# Patient Record
Sex: Female | Born: 1950 | Race: White | Hispanic: No | Smoking: Never smoker
Health system: Southern US, Community
[De-identification: ages and names within clinical notes are randomized; demographics above are authoritative.]

## PROBLEM LIST (undated history)

## (undated) DIAGNOSIS — T7840XA Allergy, unspecified, initial encounter: Secondary | ICD-10-CM

## (undated) DIAGNOSIS — M1990 Unspecified osteoarthritis, unspecified site: Secondary | ICD-10-CM

## (undated) DIAGNOSIS — J302 Other seasonal allergic rhinitis: Secondary | ICD-10-CM

## (undated) HISTORY — PX: HIP ARTHROPLASTY: SHX981

## (undated) HISTORY — DX: Other seasonal allergic rhinitis: J30.2

## (undated) HISTORY — PX: TOTAL VAGINAL HYSTERECTOMY: SHX2548

## (undated) HISTORY — DX: Allergy, unspecified, initial encounter: T78.40XA

## (undated) HISTORY — DX: Unspecified osteoarthritis, unspecified site: M19.90

## (undated) HISTORY — PX: COLONOSCOPY: SHX174

## (undated) HISTORY — PX: WISDOM TOOTH EXTRACTION: SHX21

---

## 2002-10-15 ENCOUNTER — Ambulatory Visit (HOSPITAL_BASED_OUTPATIENT_CLINIC_OR_DEPARTMENT_OTHER): Admission: RE | Admit: 2002-10-15 | Discharge: 2002-10-15 | Payer: Self-pay | Admitting: Plastic Surgery

## 2007-02-21 ENCOUNTER — Ambulatory Visit (HOSPITAL_BASED_OUTPATIENT_CLINIC_OR_DEPARTMENT_OTHER): Admission: RE | Admit: 2007-02-21 | Discharge: 2007-02-21 | Payer: Self-pay | Admitting: Orthopedic Surgery

## 2008-05-16 ENCOUNTER — Encounter (INDEPENDENT_AMBULATORY_CARE_PROVIDER_SITE_OTHER): Payer: Self-pay | Admitting: *Deleted

## 2009-02-12 ENCOUNTER — Telehealth: Payer: Self-pay | Admitting: Gastroenterology

## 2010-03-02 NOTE — Progress Notes (Signed)
Summary: Schedule Colonoscopy  Phone Note Outgoing Call   Call placed by: Hortense Ramal CMA Duncan Dull),  February 12, 2009 10:07 AM Call placed to: Patient Summary of Call: Left message on patient's voicemail that it is time for her recall colonoscopy. I have asked the patient to call back. Initial call taken by: Hortense Ramal CMA Duncan Dull),  February 12, 2009 10:08 AM  Follow-up for Phone Call        Left message to call back. Hortense Ramal CMA Duncan Dull)  February 17, 2009 12:35 PM   Left message for patient to call back. Hortense Ramal CMA Duncan Dull)  February 20, 2009 10:09 AM   Additional Follow-up for Phone Call Additional follow up Details #1::        No call recieved from patient. We will send a letter. Additional Follow-up by: Hortense Ramal CMA Duncan Dull),  February 24, 2009 9:57 AM

## 2010-03-11 ENCOUNTER — Other Ambulatory Visit: Payer: Self-pay | Admitting: Dermatology

## 2010-06-15 NOTE — Op Note (Signed)
NAME:  Brittney Stark, Brittney Stark NO.:  [id number]   MEDICAL RECORD NO.:  [id number]          PATIENT TYPE:  AMB   LOCATION:  [facility]                          FACILITY:  [facility]   PHYSICIAN:  Mila Homer. Sherlean Foot, M.D. DATE OF BIRTH:  15-Sep-1950   DATE OF PROCEDURE:  02/21/2007  DATE OF DISCHARGE:                               OPERATIVE REPORT   SURGEON:  Mila Homer. Sherlean Foot, M.D.   ASSISTANT:  None.   ANESTHESIA:  MAC.   PREOPERATIVE DIAGNOSIS:  Right knee medial and lateral meniscus tears  and osteoarthritis.   POSTOPERATIVE DIAGNOSIS:  Right knee medial and lateral meniscus tears  and osteoarthritis.   PROCEDURE:  Right knee partial medial meniscectomy and partial lateral  meniscectomy and microfracture of the medial femoral condyle.   INDICATIONS FOR PROCEDURE:  The patient is a 60 year old white female  with pain, mechanical symptoms and MRI evidence of meniscus tearing,  radiographic evidence of osteoarthritis.  Informed consent obtained.   DESCRIPTION OF PROCEDURE:  The patient was laid supine under general  anesthesia, the knee was prepped and draped in the usual sterile  fashion.  Inferolateral and inferomedial portals were created with a #11  blade and blunt trocar and cannula.  Diagnostic arthroscopy revealed no  chondromalacia in the patellofemoral joint.  No chondromalacia in the  lateral compartment but radial tearing of the entirety of the lateral  meniscus and a large radial tear of the posterior horn of the lateral  meniscus.  This was debrided with a straight basket forceps, upbiting  basket forceps and a Biochemist, clinical.  Again, minimal chondromalacia.  ACL and PCL were intact.  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  This was debrided down to  bone and then  approximately 6 microfracture holes were placed with the  pick.  The medial meniscus had large radial tears posteriorly and these  were debrided with straight basket forceps and a Automatic [employer] shaver.  I  revisited all three compartments to make sure all the cartilage had been  debrided back to stable rims of tissue and make sure all loose bodies  were debrided.  I then evacuated the fluid and instruments, closed with  4-0 nylon sutures.  Infiltrated with 10 mL of Marcaine morphine mixture  in both portals and dressed with Xeroform dressing sponges, sterile  Webril and Ace wrap.   COMPLICATIONS:  None.   DRAINS:  None.           ______________________________  Mila Homer. Sherlean Foot, M.D.     SDL/MEDQ  D:  02/21/2007  T:  02/21/2007  Job:  [id number]

## 2011-08-16 ENCOUNTER — Encounter: Payer: Self-pay | Admitting: Internal Medicine

## 2012-03-13 DIAGNOSIS — M25569 Pain in unspecified knee: Secondary | ICD-10-CM | POA: Insufficient documentation

## 2014-05-12 ENCOUNTER — Other Ambulatory Visit: Payer: Self-pay | Admitting: Internal Medicine

## 2014-05-12 DIAGNOSIS — S060X1A Concussion with loss of consciousness of 30 minutes or less, initial encounter: Secondary | ICD-10-CM

## 2014-05-14 ENCOUNTER — Ambulatory Visit
Admission: RE | Admit: 2014-05-14 | Discharge: 2014-05-14 | Disposition: A | Discharge status: Home / Self Care | Payer: BLUE CROSS/BLUE SHIELD | Source: Ambulatory Visit | Attending: Internal Medicine | Admitting: Internal Medicine

## 2014-05-14 DIAGNOSIS — S060X1A Concussion with loss of consciousness of 30 minutes or less, initial encounter: Secondary | ICD-10-CM

## 2015-08-28 ENCOUNTER — Ambulatory Visit (INDEPENDENT_AMBULATORY_CARE_PROVIDER_SITE_OTHER): Payer: Self-pay | Admitting: Urgent Care

## 2015-08-28 VITALS — BP 122/72 | HR 71 | Temp 99.1°F | Resp 17 | Ht 68.0 in | Wt 220.0 lb

## 2015-08-28 DIAGNOSIS — J069 Acute upper respiratory infection, unspecified: Secondary | ICD-10-CM

## 2015-08-28 DIAGNOSIS — J029 Acute pharyngitis, unspecified: Secondary | ICD-10-CM

## 2015-08-28 DIAGNOSIS — R05 Cough: Secondary | ICD-10-CM

## 2015-08-28 DIAGNOSIS — R059 Cough, unspecified: Secondary | ICD-10-CM

## 2015-08-28 DIAGNOSIS — D179 Benign lipomatous neoplasm, unspecified: Secondary | ICD-10-CM

## 2015-08-28 MED ORDER — HYDROCODONE-HOMATROPINE 5-1.5 MG/5ML PO SYRP: 5.0000 mL | ORAL_SOLUTION | Freq: Every evening | ORAL | 0 refills | Status: DC | PRN

## 2015-08-28 MED ORDER — BENZONATATE 100 MG PO CAPS: 100.0000 mg | ORAL_CAPSULE | Freq: Three times a day (TID) | ORAL | 0 refills | Status: DC | PRN

## 2015-08-28 NOTE — Progress Notes (Signed)
    MRN: [id number] DOB: 1950-10-26  Subjective:   Brittney Stark is a 65 y.o. female presenting for chief complaint of Cough; Sore Throat; and Headache  Reports 1 week history of productive cough, sore throat, hoarseness, headache, subjective fever. Has been taking Zyrtec daily for her allergies. Denies sinus pain, ear pain, chest pain, shob, n/v, abdominal pain, rashes. Denies smoking cigarettes.   Lynnon has a current medication list which includes the following prescription(s): cetirizine. Also has no allergies on file.  Marissia  has no past medical history on file. Also  has no past surgical history on file.  Objective:   Vitals: BP 122/72 (BP Location: Right Arm, Patient Position: Sitting, Cuff Size: Normal)   Pulse 71   Temp 99.1 F (37.3 C) (Oral)   Resp 17   Ht 5\' 8"  (1.727 m)   Wt 220 lb (99.8 kg)   SpO2 95%   BMI 33.45 kg/m   Physical Exam  Constitutional: She is oriented to person, place, and time. She appears well-developed and well-nourished.  HENT:  TM's intact bilaterally, no effusions or erythema. Nasal turbinates pink and moist, nasal passages patent. No sinus tenderness. Oropharynx clear, mucous membranes moist, dentition in good repair.  Eyes: Right eye exhibits no discharge. Left eye exhibits no discharge. No scleral icterus.  Neck: Normal range of motion. Neck supple.  Cardiovascular: Normal rate, regular rhythm and intact distal pulses.  Exam reveals no gallop and no friction rub.   No murmur heard. Pulmonary/Chest: No respiratory distress. She has no wheezes. She has no rales.  Musculoskeletal:       Arms: Lymphadenopathy:    She has no cervical adenopathy.  Neurological: She is alert and oriented to person, place, and time.  Skin: Skin is warm and dry.   Assessment and Plan :   1. Acute upper respiratory infection 2. Cough 3. Sore throat - Likely undergoing viral syndrome, advised supportive care. Call clinic on Monday if no improvement, consider antibiotic  course at that point.  4. Lipoma - Will monitor, patient will let me know if she would like to have it removed.  Jaynee Eagles, PA-C [facility] [phone] 08/28/2015 8:27 AM

## 2015-08-28 NOTE — Patient Instructions (Addendum)
Cough, Adult Coughing is a reflex that clears your throat and your airways. Coughing helps to heal and protect your lungs. It is normal to cough occasionally, but a cough that happens with other symptoms or lasts a long time may be a sign of a condition that needs treatment. A cough may last only 2-3 weeks (acute), or it may last longer than 8 weeks (chronic). CAUSES Coughing is commonly caused by:  Breathing in substances that irritate your lungs.  A viral or bacterial respiratory infection.  Allergies.  Asthma.  Postnasal drip.  Smoking.  Acid backing up from the stomach into the esophagus (gastroesophageal reflux).  Certain medicines.  Chronic lung problems, including COPD (or rarely, lung cancer).  Other medical conditions such as heart failure. HOME CARE INSTRUCTIONS  Pay attention to any changes in your symptoms. Take these actions to help with your discomfort:  Take medicines only as told by your health care provider.  If you were prescribed an antibiotic medicine, take it as told by your health care provider. Do not stop taking the antibiotic even if you start to feel better.  Talk with your health care provider before you take a cough suppressant medicine.  Drink enough fluid to keep your urine clear or pale yellow.  If the air is dry, use a cold steam vaporizer or humidifier in your bedroom or your home to help loosen secretions.  Avoid anything that causes you to cough at work or at home.  If your cough is worse at night, try sleeping in a semi-upright position.  Avoid cigarette smoke. If you smoke, quit smoking. If you need help quitting, ask your health care provider.  Avoid caffeine.  Avoid alcohol.  Rest as needed. SEEK MEDICAL CARE IF:   You have new symptoms.  You cough up pus.  Your cough does not get better after 2-3 weeks, or your cough gets worse.  You cannot control your cough with suppressant medicines and you are losing sleep.  You  develop pain that is getting worse or pain that is not controlled with pain medicines.  You have a fever.  You have unexplained weight loss.  You have night sweats. SEEK IMMEDIATE MEDICAL CARE IF:  You cough up blood.  You have difficulty breathing.  Your heartbeat is very fast.   This information is not intended to replace advice given to you by your health care provider. Make sure you discuss any questions you have with your health care provider.   Document Released: 07/16/2010 Document Revised: 10/08/2014 Document Reviewed: 03/26/2014 Elsevier Interactive Patient Education 2016 [employer]vier [employer]     IF you received an x-ray today, you will receive an invoice from [facility]. Please contact [facility] at [phone] with questions or concerns regarding your invoice.   IF you received labwork today, you will receive an invoice from [facility]. Please contact [facility] at [phone] with questions or concerns regarding your invoice.   Our billing staff will not be able to assist you with questions regarding bills from these companies.  You will be contacted with the lab results as soon as they are available. The fastest way to get your results is to activate your My Chart account. Instructions are located on the last page of this paperwork. If you have not heard from us regarding the results in 2 weeks, please contact this office.      

## 2016-08-05 ENCOUNTER — Ambulatory Visit: Payer: BLUE CROSS/BLUE SHIELD | Admitting: Physician Assistant

## 2016-08-05 ENCOUNTER — Ambulatory Visit (INDEPENDENT_AMBULATORY_CARE_PROVIDER_SITE_OTHER): Payer: Medicare Other | Admitting: Family Medicine

## 2016-08-05 ENCOUNTER — Encounter: Payer: Self-pay | Admitting: Family Medicine

## 2016-08-05 VITALS — BP 106/68 | HR 57 | Temp 98.2°F | Ht 68.0 in | Wt 153.0 lb

## 2016-08-05 DIAGNOSIS — N3000 Acute cystitis without hematuria: Secondary | ICD-10-CM

## 2016-08-05 DIAGNOSIS — R300 Dysuria: Secondary | ICD-10-CM

## 2016-08-05 LAB — POCT URINALYSIS DIPSTICK
Bilirubin, UA: NEGATIVE
Blood, UA: NEGATIVE
Glucose, UA: NEGATIVE
Ketones, UA: NEGATIVE
Nitrite, UA: NEGATIVE
Protein, UA: NEGATIVE
Spec Grav, UA: 1.01 (ref 1.010–1.025)
Urobilinogen, UA: 0.2 E.U./dL
pH, UA: 8 (ref 5.0–8.0)

## 2016-08-05 MED ORDER — CIPROFLOXACIN HCL 250 MG PO TABS: 250.0000 mg | ORAL_TABLET | Freq: Two times a day (BID) | ORAL | 0 refills | Status: DC

## 2016-08-05 NOTE — Progress Notes (Signed)
Brittney Stark is a 66 y.o. female is here to [place].   Patient Care Team: Brittney Deutscher, DO as PCP - General (Family Medicine)   History of Present Illness:   Brittney Stark CMA acting as scribe for Dr. Juleen Stark.  Dysuria   This is a new problem. The current episode started 1 to 4 weeks ago. The problem occurs intermittently. The problem has been gradually worsening. The quality of the pain is described as burning. The pain is mild. There has been no fever. She is sexually active. There is no history of pyelonephritis. Associated symptoms include frequency, hesitancy and urgency. Pertinent negatives include no chills, discharge, flank pain, hematuria, nausea, possible pregnancy, sweats or vomiting. She has tried nothing for the symptoms. There is no history of recurrent UTIs.   Health Maintenance Due  Topic Date Due  . Hepatitis C Screening  1951/01/19  . HIV Screening  03/11/1966  . TETANUS/TDAP  03/11/1970  . MAMMOGRAM  03/11/2001  . COLONOSCOPY  07/27/2013  . DEXA SCAN  03/11/2016  . PNA vac Low Risk Adult (1 of 2 - PCV13) 03/11/2016   PMHx, SurgHx, SocialHx, Medications, and Allergies were reviewed in the Visit Navigator and updated as appropriate.   Past Medical History:  Diagnosis Date  . Arthritis   . Seasonal allergies     Past Surgical History:  Procedure Laterality Date  . TOTAL VAGINAL HYSTERECTOMY    . WISDOM TOOTH EXTRACTION      Family History  Problem Relation Age of Onset  . Stroke Mother   . Heart disease Mother   . Diabetes Mother    Social History  Substance Use Topics  . Smoking status: Never Smoker  . Smokeless tobacco: Never Used  . Alcohol use Not on file    Current Medications and Allergies:   .  cetirizine (ZYRTEC) 10 MG tablet, Take 10 mg by mouth daily., Disp: , Rfl:  .  Cholecalciferol (VITAMIN D) 2000 units CAPS, Take 4,000 Units by mouth., Disp: , Rfl:  .  MAGNESIUM PO, Take 1,000 mg by mouth., Disp: , Rfl:  .  Omega-3 Fatty  Acids (FISH OIL OMEGA-3 PO), Take by mouth., Disp: , Rfl:  .  Potassium (POTASSIMIN PO), Take 2,200 mg by mouth., Disp: , Rfl:  .  vitamin C (ASCORBIC ACID) 500 MG tablet, Take 500 mg by mouth daily., Disp: , Rfl:   No Known Allergies   Review of Systems:   Review of Systems  Constitutional: Negative for chills.  Gastrointestinal: Negative for nausea and vomiting.  Genitourinary: Positive for dysuria, frequency, hesitancy and urgency. Negative for flank pain and hematuria.  All other systems reviewed and are negative.  Vitals:   Vitals:   08/05/16 0942  BP: 106/68  Pulse: (!) 57  Temp: 98.2 F (36.8 C)  TempSrc: Oral  SpO2: 96%  Weight: 153 lb (69.4 kg)  Height: 5\' 8"  (1.727 m)     Body mass index is 23.26 kg/m.  Physical Exam:   Physical Exam  Constitutional: She appears well-developed and well-nourished. No distress.  HENT:  Head: Normocephalic and atraumatic.  Right Ear: External ear normal.  Left Ear: External ear normal.  Nose: Nose normal.  Mouth/Throat: Oropharynx is clear and moist.  Eyes: Conjunctivae and EOM are normal. Pupils are equal, round, and reactive to light.  Neck: Normal range of motion. Neck supple.  Cardiovascular: Normal rate, regular rhythm, normal heart sounds and intact distal pulses.   Pulmonary/Chest: Effort normal and breath  sounds normal.  Abdominal: Soft. Bowel sounds are normal.  Skin: Skin is warm and dry.  Psychiatric: She has a normal mood and affect. Her behavior is normal.  Nursing note and vitals reviewed.  Results for orders placed or performed in visit on 08/05/16  POCT urinalysis dipstick  Result Value Ref Range   Color, UA Yellow    Clarity, UA Clear    Glucose, UA Negative    Bilirubin, UA Negative    Ketones, UA Negative    Spec Grav, UA 1.010 1.010 - 1.025   Blood, UA Negative    pH, UA 8.0 5.0 - 8.0   Protein, UA Negative    Urobilinogen, UA 0.2 0.2 or 1.0 E.U./dL   Nitrite, UA Negative    Leukocytes, UA  Trace (A) Negative   Assessment and Plan:   Brittney Stark was seen today for establish care and dysuria.  Diagnoses and all orders for this visit:  Dysuria -     POCT urinalysis dipstick  Acute cystitis without hematuria -     Urine Culture -     ciprofloxacin (CIPRO) 250 MG tablet; Take 1 tablet (250 mg total) by mouth 2 (two) times daily.   . Reviewed expectations re: course of current medical issues. . Discussed self-management of symptoms. . Outlined signs and symptoms indicating need for more acute intervention. . Patient verbalized understanding and all questions were answered. Marland Kitchen Health Maintenance issues including appropriate healthy diet, exercise, and smoking avoidance were discussed with patient. . See orders for this visit as documented in the electronic medical record. . Patient received an After Visit Summary.  CMA served as Education administrator during this visit. History, Physical, and Plan performed by medical provider. The above documentation has been reviewed and is accurate and complete. Brittney Stark, D.O.  Brittney Deutscher, DO [place] 08/05/2016

## 2016-08-06 LAB — URINE CULTURE: Organism ID, Bacteria: NO GROWTH

## 2016-11-08 DIAGNOSIS — J301 Allergic rhinitis due to pollen: Secondary | ICD-10-CM | POA: Diagnosis not present

## 2016-12-16 ENCOUNTER — Ambulatory Visit: Payer: Medicare Other | Admitting: Family Medicine

## 2016-12-30 ENCOUNTER — Ambulatory Visit (INDEPENDENT_AMBULATORY_CARE_PROVIDER_SITE_OTHER): Payer: Medicare Other | Admitting: Family Medicine

## 2016-12-30 ENCOUNTER — Encounter: Payer: Self-pay | Admitting: Gastroenterology

## 2016-12-30 VITALS — BP 118/74 | HR 55 | Temp 97.9°F | Wt 145.4 lb

## 2016-12-30 DIAGNOSIS — Z780 Asymptomatic menopausal state: Secondary | ICD-10-CM

## 2016-12-30 DIAGNOSIS — Z13220 Encounter for screening for lipoid disorders: Secondary | ICD-10-CM | POA: Diagnosis not present

## 2016-12-30 DIAGNOSIS — Z1211 Encounter for screening for malignant neoplasm of colon: Secondary | ICD-10-CM | POA: Diagnosis not present

## 2016-12-30 DIAGNOSIS — D2261 Melanocytic nevi of right upper limb, including shoulder: Secondary | ICD-10-CM

## 2016-12-30 DIAGNOSIS — Z0001 Encounter for general adult medical examination with abnormal findings: Secondary | ICD-10-CM

## 2016-12-30 DIAGNOSIS — Z0000 Encounter for general adult medical examination without abnormal findings: Secondary | ICD-10-CM

## 2016-12-30 DIAGNOSIS — Z1231 Encounter for screening mammogram for malignant neoplasm of breast: Secondary | ICD-10-CM

## 2016-12-30 DIAGNOSIS — Z79899 Other long term (current) drug therapy: Secondary | ICD-10-CM | POA: Diagnosis not present

## 2016-12-30 DIAGNOSIS — D229 Melanocytic nevi, unspecified: Secondary | ICD-10-CM

## 2016-12-30 DIAGNOSIS — J301 Allergic rhinitis due to pollen: Secondary | ICD-10-CM | POA: Diagnosis not present

## 2016-12-30 DIAGNOSIS — Z1239 Encounter for other screening for malignant neoplasm of breast: Secondary | ICD-10-CM

## 2016-12-30 NOTE — Progress Notes (Signed)
Subjective:    Brittney Stark is a 66 y.o. female who presents for Medicare Initial Preventive Examination.  Preventive Screening-Counseling & Management  Tobacco Social History   Tobacco Use  Smoking Status Never Smoker  Smokeless Tobacco Never Used    Current Problems (verified) Patient Active Problem List   Diagnosis Date Noted  . Seasonal allergic rhinitis due to pollen 12/31/2016   Medications Prior to Visit Current Outpatient Medications on File Prior to Visit  Medication Sig Dispense Refill  . cetirizine (ZYRTEC) 10 MG tablet Take 10 mg by mouth daily.    . Cholecalciferol (VITAMIN D) 2000 units CAPS Take 4,000 Units by mouth.    Marland Kitchen MAGNESIUM PO Take 1,000 mg by mouth.    . Omega-3 Fatty Acids (FISH OIL OMEGA-3 PO) Take by mouth.    . Potassium (POTASSIMIN PO) Take 2,200 mg by mouth.    . vitamin C (ASCORBIC ACID) 500 MG tablet Take 500 mg by mouth daily.     No current facility-administered medications on file prior to visit.     Current Medications (verified) Current Outpatient Medications  Medication Sig Dispense Refill  . cetirizine (ZYRTEC) 10 MG tablet Take 10 mg by mouth daily.    . Cholecalciferol (VITAMIN D) 2000 units CAPS Take 4,000 Units by mouth.    Marland Kitchen MAGNESIUM PO Take 1,000 mg by mouth.    . Omega-3 Fatty Acids (FISH OIL OMEGA-3 PO) Take by mouth.    . Potassium (POTASSIMIN PO) Take 2,200 mg by mouth.    . vitamin C (ASCORBIC ACID) 500 MG tablet Take 500 mg by mouth daily.     No current facility-administered medications for this visit.     Allergies (verified) Patient has no known allergies.   PAST HISTORY Past Medical History:  Diagnosis Date  . Arthritis   . Seasonal allergies    Past Surgical History:  Procedure Laterality Date  . TOTAL VAGINAL HYSTERECTOMY    . WISDOM TOOTH EXTRACTION     Family History  Problem Relation Age of Onset  . Stroke Mother   . Heart disease Mother   . Diabetes Mother    Social History   Tobacco Use  .  Smoking status: Never Smoker  . Smokeless tobacco: Never Used  Substance Use Topics  . Alcohol use: Not on file    Are there smokers in your home (other than you)? No  Risk Factors Current exercise habits: Gym/ health club routine includes low impact aerobics and walking on track .  Dietary issues discussed: [name], has lost > 70 pounds   Cardiac risk factors: advanced age (older than 48 for men, 72 for women).  Depression Screen (Note: if answer to either of the following is "Yes", a more complete depression screening is indicated)   Over the past 2 weeks, have you felt down, depressed or hopeless? No  Over the past 2 weeks, have you felt little interest or pleasure in doing things? No  Have you lost interest or pleasure in daily life? No  Do you often feel hopeless? No  Do you cry easily over simple problems? No  Activities of Daily Living In your present state of health, do you have any difficulty performing the following activities?:  Driving? No Managing money?  No Feeding yourself? No Getting from bed to chair? No Climbing a flight of stairs? No Preparing food and eating?: No Bathing or showering? No Getting dressed: No Getting to the toilet? No Using the toilet:No Moving around  from place to place: No In the past year have you fallen or had a near fall?:No   Are you sexually active?  Yes  Do you have more than one partner?  No  Hearing Difficulties: No Do you often ask people to speak up or repeat themselves? No Do you experience ringing or noises in your ears? No Do you have difficulty understanding soft or whispered voices? No   Do you feel that you have a problem with memory? No  Do you often misplace items? No  Do you feel safe at home?  Yes  Cognitive Testing  Alert? Yes  Normal Appearance?Yes  Oriented to person? Yes  Place? Yes   Time? Yes  Recall of three objects?  Yes  Can perform simple calculations? Yes  Displays appropriate  judgment?Yes  Can read the correct time from a watch face?Yes   Advanced Directives have been discussed with the patient? Yes  List the Names of Other Physician/Practitioners you currently use: Patient Care Team: Briscoe Deutscher, DO as PCP - General (Family Medicine) Tiajuana Amass, MD as Referring Physician (Allergy and Immunology)  Screening Tests Health Maintenance  Topic Date Due  . TETANUS/TDAP  03/11/1970  . MAMMOGRAM  03/11/2001  . COLONOSCOPY  07/27/2013  . DEXA SCAN  03/11/2016  . INFLUENZA VACCINE  09/01/2017 (Originally 08/31/2016)  . Hepatitis C Screening  12/30/2017 (Originally 12/11/1950)  . HIV Screening  12/30/2017 (Originally 03/11/1966)  . PNA vac Low Risk Adult (1 of 2 - PCV13) 12/30/2017 (Originally 03/11/2016)    All answers were reviewed with the patient and necessary referrals were made:  Briscoe Deutscher, DO   01/01/2017   History reviewed: allergies, current medications, past family history, past medical history, past social history, past surgical history and problem list  Review of Systems Pertinent items noted in HPI and remainder of comprehensive ROS otherwise negative.    Objective:     Vision by Snellen chart: right eye: 20/30, left eye:20/30  Body mass index is 22.11 kg/m. BP 118/74   Pulse (!) 55   Temp 97.9 F (36.6 C)   Wt 145 lb 6.4 oz (66 kg)   SpO2 96%   BMI 22.11 kg/m   General Appearance:    Alert, cooperative, no distress, appears stated age  Head:    Normocephalic, without obvious abnormality, atraumatic  Eyes:    PERRL, conjunctiva/corneas clear, EOM's intact, fundi    benign, both eyes  Ears:    Normal TM's and external ear canals, both ears  Nose:   Nares normal, septum midline, mucosa normal, no drainage    or sinus tenderness  Throat:   Lips, mucosa, and tongue normal; teeth and gums normal  Neck:   Supple, symmetrical, trachea midline, no adenopathy;    thyroid:  no enlargement/tenderness/nodules; no carotid   bruit or JVD   Back:     Symmetric, no curvature, ROM normal, no CVA tenderness  Lungs:     Clear to auscultation bilaterally, respirations unlabored  Chest Wall:    No tenderness or deformity   Heart:    Regular rate and rhythm, S1 and S2 normal, no murmur, rub   or gallop  Abdomen:     Soft, non-tender, bowel sounds active all four quadrants,    no masses, no organomegaly  Extremities:   Extremities normal, atraumatic, no cyanosis or edema  Pulses:   2+ and symmetric all extremities  Skin:   Skin color, texture, turgor normal, no rashes or lesions  Lymph  nodes:   Cervical, supraclavicular, and axillary nodes normal  Neurologic:   CNII-XII intact, normal strength, sensation and reflexes    throughout     EKG: normal EKG, normal sinus rhythm.   Assessment:   Diagnoses and all orders for this visit:  Encounter for initial preventive physical examination covered by Medicare -     CBC with Differential/Platelet; Future -     Comprehensive metabolic panel; Future -     Lipid panel; Future -     Ambulatory referral to Gastroenterology -     MM SCREENING BREAST TOMO BILATERAL; Future -     DG Bone Density; Future  Medication management Comments: Baseline EKG today. Orders: -     EKG 12-Lead  Atypical mole Comments: Cryotherapy  Reason: Atypical mole  Location: Left anterior wrist  Liquid nitrogen was applied using a q-tip liquid nitrogen without difficulty. Tolerated well without complications.   Post-menopausal -     DG Bone Density; Future  Screening for breast cancer -     MM SCREENING BREAST TOMO BILATERAL; Future  Lipid screening -     Lipid panel; Future  Screening for colon cancer -     Ambulatory referral to Gastroenterology  Seasonal allergic rhinitis due to pollen    Plan:     During the course of the visit the patient was educated and counseled about appropriate screening and preventive services including:    As above.  Diet review for nutrition referral?   Not Indicated  Patient Instructions (the written plan) was given to the patient.  Medicare Attestation I have personally reviewed: The patient's medical and social history Their use of alcohol, tobacco or illicit drugs Their current medications and supplements The patient's functional ability including ADLs,fall risks, home safety risks, cognitive, and hearing and visual impairment Diet and physical activities Evidence for depression or mood disorders  The patient's weight, height, BMI, and visual acuity have been recorded in the chart.  I have made referrals, counseling, and provided education to the patient based on review of the above and I have provided the patient with a written personalized care plan for preventive services.     Briscoe Deutscher, DO   01/01/2017

## 2016-12-31 DIAGNOSIS — D229 Melanocytic nevi, unspecified: Secondary | ICD-10-CM | POA: Insufficient documentation

## 2016-12-31 DIAGNOSIS — J301 Allergic rhinitis due to pollen: Secondary | ICD-10-CM | POA: Insufficient documentation

## 2017-01-01 ENCOUNTER — Encounter: Payer: Self-pay | Admitting: Family Medicine

## 2017-01-09 ENCOUNTER — Other Ambulatory Visit: Payer: Medicare Other

## 2017-01-12 ENCOUNTER — Other Ambulatory Visit (INDEPENDENT_AMBULATORY_CARE_PROVIDER_SITE_OTHER): Payer: Medicare Other

## 2017-01-12 DIAGNOSIS — Z13220 Encounter for screening for lipoid disorders: Secondary | ICD-10-CM | POA: Diagnosis not present

## 2017-01-12 DIAGNOSIS — Z0000 Encounter for general adult medical examination without abnormal findings: Secondary | ICD-10-CM | POA: Diagnosis not present

## 2017-01-12 LAB — CBC WITH DIFFERENTIAL/PLATELET
Basophils Absolute: 0.2 10*3/uL — ABNORMAL HIGH (ref 0.0–0.1)
Basophils Relative: 4.1 % — ABNORMAL HIGH (ref 0.0–3.0)
Eosinophils Absolute: 0.1 10*3/uL (ref 0.0–0.7)
Eosinophils Relative: 2.4 % (ref 0.0–5.0)
HCT: 41.4 % (ref 36.0–46.0)
Hemoglobin: 13.9 g/dL (ref 12.0–15.0)
Lymphocytes Relative: 32.9 % (ref 12.0–46.0)
Lymphs Abs: 1.6 10*3/uL (ref 0.7–4.0)
MCHC: 33.6 g/dL (ref 30.0–36.0)
MCV: 102.1 fl — ABNORMAL HIGH (ref 78.0–100.0)
Monocytes Absolute: 0.3 10*3/uL (ref 0.1–1.0)
Monocytes Relative: 5.5 % (ref 3.0–12.0)
Neutro Abs: 2.7 10*3/uL (ref 1.4–7.7)
Neutrophils Relative %: 55.1 % (ref 43.0–77.0)
Platelets: 284 10*3/uL (ref 150.0–400.0)
RBC: 4.06 Mil/uL (ref 3.87–5.11)
RDW: 13.5 % (ref 11.5–15.5)
WBC: 4.9 10*3/uL (ref 4.0–10.5)

## 2017-01-12 LAB — LIPID PANEL
Cholesterol: 159 mg/dL (ref 0–200)
HDL: 68.2 mg/dL (ref >39.00)
LDL Cholesterol: 74 mg/dL (ref 0–99)
NonHDL: 90.97
Total CHOL/HDL Ratio: 2
Triglycerides: 87 mg/dL (ref 0.0–149.0)
VLDL: 17.4 mg/dL (ref 0.0–40.0)

## 2017-01-12 LAB — COMPREHENSIVE METABOLIC PANEL
ALT: 27 U/L (ref 0–35)
AST: 20 U/L (ref 0–37)
Albumin: 4.5 g/dL (ref 3.5–5.2)
Alkaline Phosphatase: 49 U/L (ref 39–117)
BUN: 16 mg/dL (ref 6–23)
CO2: 33 mEq/L — ABNORMAL HIGH (ref 19–32)
Calcium: 9.4 mg/dL (ref 8.4–10.5)
Chloride: 100 mEq/L (ref 96–112)
Creatinine, Ser: 0.78 mg/dL (ref 0.40–1.20)
GFR: 78.58 mL/min (ref >60.00)
Glucose, Bld: 94 mg/dL (ref 70–99)
Potassium: 4.6 mEq/L (ref 3.5–5.1)
Sodium: 138 mEq/L (ref 135–145)
Total Bilirubin: 0.9 mg/dL (ref 0.2–1.2)
Total Protein: 6.5 g/dL (ref 6.0–8.3)

## 2017-01-16 ENCOUNTER — Other Ambulatory Visit: Payer: Self-pay

## 2017-01-16 DIAGNOSIS — E2839 Other primary ovarian failure: Secondary | ICD-10-CM

## 2017-01-27 ENCOUNTER — Other Ambulatory Visit: Payer: Self-pay

## 2017-01-27 ENCOUNTER — Ambulatory Visit (AMBULATORY_SURGERY_CENTER): Payer: Self-pay | Admitting: *Deleted

## 2017-01-27 VITALS — Ht 68.0 in | Wt 148.2 lb

## 2017-01-27 DIAGNOSIS — Z1211 Encounter for screening for malignant neoplasm of colon: Secondary | ICD-10-CM

## 2017-01-27 MED ORDER — NA SULFATE-K SULFATE-MG SULF 17.5-3.13-1.6 GM/177ML PO SOLN: 1.0000 [IU] | Freq: Once | ORAL | 0 refills | Status: AC

## 2017-01-27 NOTE — Progress Notes (Signed)
No egg or soy allergy known to patient  No issues with past sedation with any surgeries  or procedures, no intubation problems  No diet pills per patient No home 02 use per patient  No blood thinners per patient  Pt denies issues with constipation  No A fib or A flutter  EMMI video sent to pt's e mail pt declined   

## 2017-02-06 ENCOUNTER — Ambulatory Visit (AMBULATORY_SURGERY_CENTER): Payer: Medicare Other | Admitting: Gastroenterology

## 2017-02-06 ENCOUNTER — Other Ambulatory Visit: Payer: Self-pay

## 2017-02-06 ENCOUNTER — Encounter: Payer: Self-pay | Admitting: Gastroenterology

## 2017-02-06 VITALS — BP 112/67 | HR 58 | Temp 96.8°F | Resp 12 | Ht 68.0 in | Wt 148.0 lb

## 2017-02-06 DIAGNOSIS — D124 Benign neoplasm of descending colon: Secondary | ICD-10-CM | POA: Diagnosis not present

## 2017-02-06 DIAGNOSIS — K635 Polyp of colon: Secondary | ICD-10-CM

## 2017-02-06 DIAGNOSIS — Z8371 Family history of colonic polyps: Secondary | ICD-10-CM | POA: Diagnosis not present

## 2017-02-06 DIAGNOSIS — Z1211 Encounter for screening for malignant neoplasm of colon: Secondary | ICD-10-CM

## 2017-02-06 DIAGNOSIS — D125 Benign neoplasm of sigmoid colon: Secondary | ICD-10-CM

## 2017-02-06 DIAGNOSIS — Z800 Family history of malignant neoplasm of digestive organs: Secondary | ICD-10-CM

## 2017-02-06 DIAGNOSIS — K640 First degree hemorrhoids: Secondary | ICD-10-CM | POA: Diagnosis not present

## 2017-02-06 DIAGNOSIS — Z1212 Encounter for screening for malignant neoplasm of rectum: Secondary | ICD-10-CM | POA: Diagnosis not present

## 2017-02-06 DIAGNOSIS — K5730 Diverticulosis of large intestine without perforation or abscess without bleeding: Secondary | ICD-10-CM

## 2017-02-06 DIAGNOSIS — D123 Benign neoplasm of transverse colon: Secondary | ICD-10-CM

## 2017-02-06 MED ORDER — SODIUM CHLORIDE 0.9 % IV SOLN: 500.0000 mL | Freq: Once | INTRAVENOUS | Status: DC

## 2017-02-06 NOTE — Progress Notes (Signed)
Pt's states no medical or surgical changes since previsit or office visit. 

## 2017-02-06 NOTE — Progress Notes (Signed)
Called to room to assist during endoscopic procedure.  Patient ID and intended procedure confirmed with present staff. Received instructions for my participation in the procedure from the performing physician.  

## 2017-02-06 NOTE — Progress Notes (Signed)
Report to PACU, RN, vss, BBS= Clear.  

## 2017-02-06 NOTE — Patient Instructions (Signed)
YOU HAD AN ENDOSCOPIC PROCEDURE TODAY AT [facility]:   Refer to the procedure report that was given to you for any specific questions about what was found during the examination.  If the procedure report does not answer your questions, please call your gastroenterologist to clarify.  If you requested that your care partner not be given the details of your procedure findings, then the procedure report has been included in a sealed envelope for you to review at your convenience later.  YOU SHOULD EXPECT: Some feelings of bloating in the abdomen. Passage of more gas than usual.  Walking can help get rid of the air that was put into your GI tract during the procedure and reduce the bloating. If you had a lower endoscopy (such as a colonoscopy or flexible sigmoidoscopy) you may notice spotting of blood in your stool or on the toilet paper. If you underwent a bowel prep for your procedure, you may not have a normal bowel movement for a few days.  Please Note:  You might notice some irritation and congestion in your nose or some drainage.  This is from the oxygen used during your procedure.  There is no need for concern and it should clear up in a day or so.  SYMPTOMS TO REPORT IMMEDIATELY:   Following lower endoscopy (colonoscopy or flexible sigmoidoscopy):  Excessive amounts of blood in the stool  Significant tenderness or worsening of abdominal pains  Swelling of the abdomen that is new, acute  Fever of 100F or higher   For urgent or emergent issues, a gastroenterologist can be reached at any hour by calling [phone].   DIET:  We do recommend a small meal at first, but then you may proceed to your regular diet.  Drink plenty of fluids but you should avoid alcoholic beverages for 24 hours. Try to increase the fiber in your diet, and drink plenty of water.  ACTIVITY:  You should plan to take it easy for the rest of today and you should NOT DRIVE or use heavy machinery until  tomorrow (because of the sedation medicines used during the test).    FOLLOW UP: Our staff will call the number listed on your records the next business day following your procedure to check on you and address any questions or concerns that you may have regarding the information given to you following your procedure. If we do not reach you, we will leave a message.  However, if you are feeling well and you are not experiencing any problems, there is no need to return our call.  We will assume that you have returned to your regular daily activities without incident.  If any biopsies were taken you will be contacted by phone or by letter within the next 1-3 weeks.  Please call us at [phone] if you have not heard about the biopsies in 3 weeks.    SIGNATURES/CONFIDENTIALITY: You and/or your care partner have signed paperwork which will be entered into your electronic medical record.  These signatures attest to the fact that that the information above on your After Visit Summary has been reviewed and is understood.  Full responsibility of the confidentiality of this discharge information lies with you and/or your care-partner.  Recall colon in 5 years per Dr. Fuller Plan.

## 2017-02-06 NOTE — Op Note (Signed)
[facility] Patient Name: Brittney Stark Procedure Date: 02/06/2017 2:48 PM MRN: [id number] Endoscopist: Ladene Artist , MD Age: 67 Referring MD:  Date of Birth: [birth year] Gender: Female Account #: [id number] Procedure:                Colonoscopy Indications:              Colon cancer screening in patient at increased                            risk: Family history of 1st-degree relative with                            colon polyps, Colon cancer screening in patient at                            increased risk: Family history of colorectal cancer                            in multiple 2nd degree relatives Medicines:                Monitored Anesthesia Care Procedure:                Pre-Anesthesia Assessment:                           - Prior to the procedure, a History and Physical                            was performed, and patient medications and                            allergies were reviewed. The patient's tolerance of                            previous anesthesia was also reviewed. The risks                            and benefits of the procedure and the sedation                            options and risks were discussed with the patient.                            All questions were answered, and informed consent                            was obtained. Prior Anticoagulants: The patient has                            taken no previous anticoagulant or antiplatelet                            agents. ASA Grade Assessment: II - A patient with  mild systemic disease. After reviewing the risks                            and benefits, the patient was deemed in                            satisfactory condition to undergo the procedure.                           After obtaining informed consent, the colonoscope                            was passed under direct vision. Throughout the                            procedure, the patient's blood  pressure, pulse, and                            oxygen saturations were monitored continuously. The                            Colonoscope was introduced through the anus and                            advanced to the the cecum, identified by                            appendiceal orifice and ileocecal valve. The                            ileocecal valve, appendiceal orifice, and rectum                            were photographed. The quality of the bowel                            preparation was adequate after extensive lavage and                            suctioning. The colonoscopy was performed without                            difficulty. The patient tolerated the procedure                            well. Scope In: 2:56:43 PM Scope Out: 3:16:54 PM Scope Withdrawal Time: 0 hours 15 minutes 9 seconds  Total Procedure Duration: 0 hours 20 minutes 11 seconds  Findings:                 The perianal and digital rectal examinations were                            normal.  A 5 mm polyp was found in the descending colon. The                            polyp was sessile. The polyp was removed with a                            cold biopsy forceps. Resection and retrieval were                            complete.                           Two sessile polyps were found in the sigmoid colon                            and transverse colon. The polyps were 6 to 7 mm in                            size. These polyps were removed with a cold snare.                            Resection and retrieval were complete.                           A few small-mouthed diverticula were found in the                            left colon. There was no evidence of diverticular                            bleeding.                           Internal hemorrhoids were found during                            retroflexion. The hemorrhoids were small and Grade                            I  (internal hemorrhoids that do not prolapse).                           The exam was otherwise without abnormality on                            direct and retroflexion views. Complications:            No immediate complications. Estimated blood loss:                            None. Estimated Blood Loss:     Estimated blood loss: none. Impression:               - One 5 mm polyp in the descending colon, removed  with a cold biopsy forceps. Resected and retrieved.                           - Two 6 to 7 mm polyps in the sigmoid colon and in                            the transverse colon, removed with a cold snare.                            Resected and retrieved.                           - Mild diverticulosis in the left colon. There was                            no evidence of diverticular bleeding.                           - Internal hemorrhoids.                           - The examination was otherwise normal on direct                            and retroflexion views. Recommendation:           - Repeat colonoscopy in 5 years for surveillance.                           - Patient has a contact number available for                            emergencies. The signs and symptoms of potential                            delayed complications were discussed with the                            patient. Return to normal activities tomorrow.                            Written discharge instructions were provided to the                            patient.                           - Resume previous diet.                           - Continue present medications.                           - Await pathology results. Ladene Artist, MD 02/06/2017 3:21:22 PM This report has been signed electronically.

## 2017-02-07 ENCOUNTER — Telehealth: Payer: Self-pay | Admitting: *Deleted

## 2017-02-07 ENCOUNTER — Telehealth: Payer: Self-pay | Admitting: Family Medicine

## 2017-02-07 NOTE — Telephone Encounter (Signed)
  Follow up Call-  Call back number 02/06/2017  Post procedure Call Back phone  # [phone]  Permission to leave phone message Yes  Some recent data might be hidden     Patient questions:  Do you have a fever, pain , or abdominal swelling? No. Pain Score  0 *  Have you tolerated food without any problems? Yes.    Have you been able to return to your normal activities? Yes.    Do you have any questions about your discharge instructions: Diet   No. Medications  No. Follow up visit  No.  Do you have questions or concerns about your Care? No.  Actions: * If pain score is 4 or above: No action needed, pain <4.

## 2017-02-07 NOTE — Telephone Encounter (Signed)
Please advise if this has been corrected and patient has been scheduled.   Copied from [place]. Topic: General - Other >> Jan 11, 2017  3:44 PM Brittney Stark wrote: Reason for CRM: Patient called because she was trying to schedule her bone density appt. She was told that she could not do so because the orders are incorrect. Patient is requesting the orders be corrected and resend so she can schedule her appt.

## 2017-02-08 NOTE — Telephone Encounter (Signed)
Called patient she had it scheduled. No questions at this time.

## 2017-02-15 ENCOUNTER — Ambulatory Visit
Admission: RE | Admit: 2017-02-15 | Discharge: 2017-02-15 | Disposition: A | Discharge status: Home / Self Care | Payer: Medicare Other | Source: Ambulatory Visit | Attending: Family Medicine | Admitting: Family Medicine

## 2017-02-15 DIAGNOSIS — Z1231 Encounter for screening mammogram for malignant neoplasm of breast: Secondary | ICD-10-CM | POA: Diagnosis not present

## 2017-02-15 DIAGNOSIS — Z780 Asymptomatic menopausal state: Secondary | ICD-10-CM | POA: Diagnosis not present

## 2017-02-15 DIAGNOSIS — E2839 Other primary ovarian failure: Secondary | ICD-10-CM

## 2017-02-15 DIAGNOSIS — Z0000 Encounter for general adult medical examination without abnormal findings: Secondary | ICD-10-CM

## 2017-02-15 DIAGNOSIS — Z1239 Encounter for other screening for malignant neoplasm of breast: Secondary | ICD-10-CM

## 2017-02-15 DIAGNOSIS — M8589 Other specified disorders of bone density and structure, multiple sites: Secondary | ICD-10-CM | POA: Diagnosis not present

## 2017-02-16 ENCOUNTER — Encounter: Payer: Self-pay | Admitting: Gastroenterology

## 2017-06-20 DIAGNOSIS — H40013 Open angle with borderline findings, low risk, bilateral: Secondary | ICD-10-CM | POA: Diagnosis not present

## 2017-06-20 DIAGNOSIS — H2513 Age-related nuclear cataract, bilateral: Secondary | ICD-10-CM | POA: Diagnosis not present

## 2017-06-20 DIAGNOSIS — H16213 Exposure keratoconjunctivitis, bilateral: Secondary | ICD-10-CM | POA: Diagnosis not present

## 2017-06-20 DIAGNOSIS — H16143 Punctate keratitis, bilateral: Secondary | ICD-10-CM | POA: Diagnosis not present

## 2018-06-19 DIAGNOSIS — H40013 Open angle with borderline findings, low risk, bilateral: Secondary | ICD-10-CM | POA: Diagnosis not present

## 2018-06-19 DIAGNOSIS — H2513 Age-related nuclear cataract, bilateral: Secondary | ICD-10-CM | POA: Diagnosis not present

## 2018-07-13 ENCOUNTER — Telehealth: Payer: Self-pay

## 2018-07-13 NOTE — Telephone Encounter (Signed)
Copied from [place] [id number]. Topic: General - Other >> Jul 13, 2018 10:34 AM Leward Quan A wrote: Reason for CRM: Patient called to say that was bit by ticks and has little red and itchy spots. Asking if there is something that she should be looking out for and should she be concerned. Please advise Ph# [phone]

## 2018-07-18 NOTE — Telephone Encounter (Signed)
Spoke to patient.  She was bit by several ticks on 6/10.  Two on her knees and 1 on her buttocks.  Denies any fever, arthralgias and states that the redness has gone down.  States that she has been swabbing the areas of the bites with alcohol and just wanted to know if there is anything else that she should be doing.  Advised I would route message to Dr. Juleen China to see if she needs appointment or continue to observe.  Patient verbalized understanding.

## 2018-07-19 NOTE — Telephone Encounter (Signed)
If ticks were removed prior to 24 hours, should not be an issue. [place] Spotted Fever is the most common tick illness in our region. Watch for redness, fatigue, aches. Let me know if there is more to this - may require a virtual visit to discuss.

## 2018-07-19 NOTE — Telephone Encounter (Signed)
Called no answer no vm. 

## 2018-07-20 NOTE — Telephone Encounter (Signed)
I informed patient of Dr. Alcario Drought message and she said that she would call if symptoms worsen.

## 2018-09-03 ENCOUNTER — Other Ambulatory Visit: Payer: Self-pay

## 2018-12-25 ENCOUNTER — Telehealth: Payer: Self-pay | Admitting: Family Medicine

## 2018-12-25 NOTE — Telephone Encounter (Signed)
I called the patient to schedule AWV with Loma Sousa, but there was no answer and no option to leave a message. If patient calls back, please schedule Medicare Wellness Visit at next available opening.  VDM (Dee-Dee)

## 2019-01-03 ENCOUNTER — Other Ambulatory Visit: Payer: Self-pay

## 2019-01-04 ENCOUNTER — Ambulatory Visit (INDEPENDENT_AMBULATORY_CARE_PROVIDER_SITE_OTHER): Payer: Medicare Other | Admitting: Family Medicine

## 2019-01-04 ENCOUNTER — Encounter: Payer: Self-pay | Admitting: Family Medicine

## 2019-01-04 VITALS — BP 100/72 | HR 59 | Temp 97.3°F | Ht 68.0 in | Wt 151.4 lb

## 2019-01-04 DIAGNOSIS — Z1159 Encounter for screening for other viral diseases: Secondary | ICD-10-CM

## 2019-01-04 DIAGNOSIS — Z13220 Encounter for screening for lipoid disorders: Secondary | ICD-10-CM | POA: Diagnosis not present

## 2019-01-04 DIAGNOSIS — D7589 Other specified diseases of blood and blood-forming organs: Secondary | ICD-10-CM

## 2019-01-04 LAB — CBC WITH DIFFERENTIAL/PLATELET
Basophils Absolute: 0.1 10*3/uL (ref 0.0–0.1)
Basophils Relative: 1.4 % (ref 0.0–3.0)
Eosinophils Absolute: 0.2 10*3/uL (ref 0.0–0.7)
Eosinophils Relative: 5 % (ref 0.0–5.0)
HCT: 39.5 % (ref 36.0–46.0)
Hemoglobin: 13 g/dL (ref 12.0–15.0)
Lymphocytes Relative: 31.7 % (ref 12.0–46.0)
Lymphs Abs: 1.3 10*3/uL (ref 0.7–4.0)
MCHC: 33 g/dL (ref 30.0–36.0)
MCV: 102.6 fl — ABNORMAL HIGH (ref 78.0–100.0)
Monocytes Absolute: 0.3 10*3/uL (ref 0.1–1.0)
Monocytes Relative: 8.1 % (ref 3.0–12.0)
Neutro Abs: 2.1 10*3/uL (ref 1.4–7.7)
Neutrophils Relative %: 53.8 % (ref 43.0–77.0)
Platelets: 266 10*3/uL (ref 150.0–400.0)
RBC: 3.85 Mil/uL — ABNORMAL LOW (ref 3.87–5.11)
RDW: 13.6 % (ref 11.5–15.5)
WBC: 4 10*3/uL (ref 4.0–10.5)

## 2019-01-04 LAB — COMPREHENSIVE METABOLIC PANEL
ALT: 19 U/L (ref 0–35)
AST: 19 U/L (ref 0–37)
Albumin: 4.4 g/dL (ref 3.5–5.2)
Alkaline Phosphatase: 59 U/L (ref 39–117)
BUN: 28 mg/dL — ABNORMAL HIGH (ref 6–23)
CO2: 29 mEq/L (ref 19–32)
Calcium: 9.2 mg/dL (ref 8.4–10.5)
Chloride: 102 mEq/L (ref 96–112)
Creatinine, Ser: 0.75 mg/dL (ref 0.40–1.20)
GFR: 76.89 mL/min (ref >60.00)
Glucose, Bld: 90 mg/dL (ref 70–99)
Potassium: 4.9 mEq/L (ref 3.5–5.1)
Sodium: 138 mEq/L (ref 135–145)
Total Bilirubin: 0.5 mg/dL (ref 0.2–1.2)
Total Protein: 6.5 g/dL (ref 6.0–8.3)

## 2019-01-04 LAB — LIPID PANEL
Cholesterol: 167 mg/dL (ref 0–200)
HDL: 71.2 mg/dL (ref >39.00)
LDL Cholesterol: 86 mg/dL (ref 0–99)
NonHDL: 95.3
Total CHOL/HDL Ratio: 2
Triglycerides: 49 mg/dL (ref 0.0–149.0)
VLDL: 9.8 mg/dL (ref 0.0–40.0)

## 2019-01-04 MED ORDER — AZELASTINE-FLUTICASONE 137-50 MCG/ACT NA SUSP: 1.0000 | Freq: Two times a day (BID) | NASAL | 1 refills | Status: DC

## 2019-01-04 NOTE — Progress Notes (Deleted)
Patient: Brittney Stark MRN: [id number] DOB: [birth year] PCP: Briscoe Deutscher, DO     Subjective:  Chief Complaint  Patient presents with  . Transitions Of Care  . Annual Exam    HPI: The patient is a 68 y.o. female who presents today for annual exam. {He/she (caps):30048} denies any changes to past medical history. There have been no recent hospitalizations. They {Actions; are/are not:16769} following a well balanced diet and exercise plan. Weight has been {trend:16658}. No complaints today.   Immunization History  Administered Date(s) Administered  . Tdap 08/06/2017   Colonoscopy: Mammogram:  Pap smear:  PSA:   Review of Systems  Constitutional: Negative for fatigue.  HENT: Positive for rhinorrhea. Negative for congestion, postnasal drip and sore throat.   Eyes: Negative for visual disturbance.  Respiratory: Negative for shortness of breath.   Cardiovascular: Negative for chest pain, palpitations and leg swelling.  Gastrointestinal: Negative for abdominal pain, constipation, diarrhea, nausea and vomiting.  Endocrine: Negative for cold intolerance, heat intolerance, polydipsia and polyuria.  Genitourinary: Negative for dysuria, frequency and urgency.  Musculoskeletal: Negative for back pain and neck pain.  Skin: Negative for rash.  Neurological: Negative for dizziness and headaches.  Psychiatric/Behavioral: Positive for sleep disturbance.    Allergies Patient has No Known Allergies.  Past Medical History Patient  has a past medical history of Allergy, Arthritis, and Seasonal allergies.  Surgical History Patient  has a past surgical history that includes Wisdom tooth extraction; Total vaginal hysterectomy; and Colonoscopy.  Family History Pateint's family history includes Colon polyps in her brother; Diabetes in her mother; Heart disease in her mother; Stroke in her mother.  Social History Patient  reports that she has never smoked. She has never used smokeless tobacco.  She reports current alcohol use of about 3.0 standard drinks of alcohol per week. She reports that she does not use drugs.    Objective: Vitals:   01/04/19 0824  BP: 100/72  Pulse: (!) 59  Temp: (!) 97.3 F (36.3 C)  TempSrc: Skin  SpO2: 99%  Weight: 151 lb 6.4 oz (68.7 kg)  Height: 5\' 8"  (1.727 m)    Body mass index is 23.02 kg/m.  Physical Exam     Depression screen [facility] 2/9 01/04/2019 12/30/2016 08/28/2015  Decreased Interest 0 0 0  Down, Depressed, Hopeless 0 0 0  PHQ - 2 Score 0 0 0     Assessment/plan:   No problem-specific Assessment & Plan notes found for this encounter.    No follow-ups on file.     Kevan Ny, MD [place]  01/04/2019'

## 2019-01-04 NOTE — Patient Instructions (Addendum)
So nice to meet you!  Merry christmas! Dr. Rogers Blocker   Your bone density shows you to be osteopenic. This means you have thinning bones and are at risk for osteoporosis. I would recommend that you do weight bearing activities to help increase your bone density and start calcium and vitamin D daily. Recommend 1200mg  calcium and 800-1000IU/vitamin D daily. Would recheck your bone scan in 3 years time.     Brittney Stark , Thank you for taking time to come for your Medicare Wellness Visit. I appreciate your ongoing commitment to your health goals. Please review the following plan we discussed and let me know if I can assist you in the future.   These are the goals we discussed: Goals   None     This is a list of the screening recommended for you and due dates:  Health Maintenance  Topic Date Due  . Flu Shot  01/03/2020*  . Pneumonia vaccines (1 of 2 - PCV13) 01/04/2020*  . Mammogram  02/16/2019  . Colon Cancer Screening  02/06/2022  . Tetanus Vaccine  08/07/2027  . DEXA scan (bone density measurement)  Completed  .  Hepatitis C: One time screening is recommended by Center for Disease Control  (CDC) for  adults born from [birth year] through 1964.   Completed  *Topic was postponed. The date shown is not the original due date.

## 2019-01-04 NOTE — Progress Notes (Addendum)
Brittney Stark  Phone: [phone]  Subjective:  Patient presents today for their annual Medicare Exam    Preventive Screening-Counseling & Management  She has past medical history of allergic rhinitis only. She is on no medication as it didn't work for her.   No exam data present  Advanced directives: she has a living will at home.   Smoking Status: Never Smoker Second Hand Smoking status: No smokers in home  Risk Factors Regular exercise: gym daily Diet: well balanced.   Fall Risk: None  Fall Risk  01/04/2019 09/03/2018 12/30/2016 08/28/2015  Falls in the past year? 0 (No Data) No Yes  Comment - Emmi Telephone Survey: data to providers prior to load - -  Number falls in past yr: 0 (No Data) - 1  Comment - Emmi Telephone Survey Actual Response =  - -  Injury with Fall? 0 - - Yes   Opioid use history:  no long term opioids use  Cardiac risk factors:  advanced age (older than 51 for men, 35 for women) yes Hyperlipidemia none No diabetes.  Family History: maternal CAD with MI and CVA. Paternal MI.    Depression Screen None. PHQ2 0  Depression screen [facility] 2/9 01/04/2019 01/04/2019 12/30/2016 08/28/2015  Decreased Interest 0 0 0 0  Down, Depressed, Hopeless 0 0 0 0  PHQ - 2 Score 0 0 0 0    Activities of Daily Living Independent ADLs and IADLs   Hearing Difficulties: -patient declines  Cognitive Testing: mini cog: 5/5 No reported trouble.    Normal 3 word recall  List the Names of Other Physician/Practitioners you currently use: -none  Immunization History  Administered Date(s) Administered  . Tdap 08/06/2017   Required Immunizations needed today pneumovax 23, but declines.   Screening tests- up to date There are no preventive care reminders to display for this patient.  Review of Systems  Constitutional: Negative for chills, fever and malaise/fatigue.  HENT: Negative for hearing loss and sore throat.   Eyes: Negative for blurred vision and double vision.   Respiratory: Negative for cough, shortness of breath and wheezing.   Cardiovascular: Negative for chest pain, palpitations and leg swelling.  Gastrointestinal: Negative for abdominal pain, blood in stool, nausea and vomiting.  Genitourinary: Negative for dysuria and hematuria.  Musculoskeletal: Negative for falls.  Skin: Negative for rash.  Neurological: Negative for dizziness and weakness.  Psychiatric/Behavioral: Negative for memory loss and suicidal ideas. The patient is not nervous/anxious and does not have insomnia.      The following were reviewed and entered/updated in epic: Past Medical History:  Diagnosis Date  . Allergy   . Arthritis   . Seasonal allergies    Patient Active Problem List   Diagnosis Date Noted  . Seasonal allergic rhinitis due to pollen 12/31/2016   Past Surgical History:  Procedure Laterality Date  . COLONOSCOPY    . TOTAL VAGINAL HYSTERECTOMY    . WISDOM TOOTH EXTRACTION      Family History  Problem Relation Age of Onset  . Stroke Mother   . Heart disease Mother   . Diabetes Mother   . Colon polyps Brother   . Colon cancer Neg Hx   . Esophageal cancer Neg Hx   . Rectal cancer Neg Hx   . Stomach cancer Neg Hx     Medications- reviewed and updated Current Outpatient Medications  Medication Sig Dispense Refill  . Azelastine-Fluticasone 137-50 MCG/ACT SUSP Place 1 spray into the nose 2 (two) times daily. 23 g  1   No current facility-administered medications for this visit.     Allergies-reviewed and updated No Known Allergies  Social History   Socioeconomic History  . Marital status: Unknown    Spouse name: Not on file  . Number of children: Not on file  . Years of education: Not on file  . Highest education level: Not on file  Occupational History  . Not on file  Social Needs  . Financial resource strain: Not on file  . Food insecurity    Worry: Not on file    Inability: Not on file  . Transportation needs    Medical: Not  on file    Non-medical: Not on file  Tobacco Use  . Smoking status: Never Smoker  . Smokeless tobacco: Never Used  Substance and Sexual Activity  . Alcohol use: Yes    Alcohol/week: 3.0 standard drinks    Types: 3 Glasses of wine per week  . Drug use: No  . Sexual activity: Never  Lifestyle  . Physical activity    Days per week: Not on file    Minutes per session: Not on file  . Stress: Not on file  Relationships  . Social Herbalist on phone: Not on file    Gets together: Not on file    Attends religious service: Not on file    Active member of club or organization: Not on file    Attends meetings of clubs or organizations: Not on file    Relationship status: Not on file  Other Topics Concern  . Not on file  Social History Narrative   Lost > 65 pounds using Rickard Patience.     Objective: BP 100/72 (BP Location: Left Arm, Patient Position: Sitting, Cuff Size: Normal)   Pulse (!) 59   Temp (!) 97.3 F (36.3 C) (Skin)   Ht 5\' 8"  (1.727 m)   Wt 151 lb 6.4 oz (68.7 kg)   SpO2 99%   BMI 23.02 kg/m  Gen: NAD, resting comfortably HEENT: Mucous membranes are moist. Oropharynx normal Neck: no thyromegaly CV: RRR no murmurs rubs or gallops Lungs: CTAB no crackles, wheeze, rhonchi Abdomen: soft/nontender/nondistended/normal bowel sounds. No rebound or guarding.  Ext: no edema Skin: warm, dry Neuro: grossly normal, moves all extremities, PERRLA  Assessment/Plan:  Medicare exam completed- discussed recommended screenings anddocumented any personalized health advice and referrals for preventive counseling. See AVS as well which was given to patient.   Status of chronic or acute concerns  Allergic rhinitis: trial of dymista.   No problem-specific Assessment & Plan notes found for this encounter.   No future appointments. Return in about 1 year (around 01/04/2020).   This visit occurred during the SARS-CoV-2 public health emergency.  Safety protocols were in  place, including screening questions prior to the visit, additional usage of staff PPE, and extensive cleaning of exam room while observing appropriate contact time as indicated for disinfecting solutions.      Lab/Order associations: Encounter for hepatitis C screening test for low risk patient - Plan: Hepatitis C antibody  Macrocytosis - Plan: CBC with Differential/Platelet, Comprehensive metabolic panel  Screening cholesterol level - Plan: Lipid panel  Meds ordered this encounter  Medications  . Azelastine-Fluticasone 137-50 MCG/ACT SUSP    Sig: Place 1 spray into the nose 2 (two) times daily.    Dispense:  23 g    Refill:  1    Return precautions advised. Orma Flaming, MD

## 2019-01-07 LAB — HEPATITIS C ANTIBODY
Hepatitis C Ab: NONREACTIVE
SIGNAL TO CUT-OFF: 0.01 (ref <1.00)

## 2019-04-04 ENCOUNTER — Ambulatory Visit: Payer: Medicare Other | Attending: Internal Medicine

## 2019-04-04 DIAGNOSIS — Z23 Encounter for immunization: Secondary | ICD-10-CM | POA: Insufficient documentation

## 2019-04-04 NOTE — Progress Notes (Signed)
   Covid-19 Vaccination Clinic  Name:  Brittney Stark    MRN: [id number] DOB: 10/31/1950  04/04/2019  [name] was observed post Covid-19 immunization for 15 minutes without incident. She was provided with Vaccine Information Sheet and instruction to access the V-Safe system.   [name] was instructed to call 911 with any severe reactions post vaccine: Marland Kitchen Difficulty breathing  . Swelling of face and throat  . A fast heartbeat  . A bad rash all over body  . Dizziness and weakness   Immunizations Administered    Name Date Dose VIS Date Route   Pfizer COVID-19 Vaccine 04/04/2019  4:35 PM 0.3 mL 01/11/2019 Intramuscular   Manufacturer: [place]   Lot: [id number]   [place]: [id number]

## 2019-05-01 ENCOUNTER — Ambulatory Visit: Payer: Medicare Other | Attending: Internal Medicine

## 2019-05-01 DIAGNOSIS — Z23 Encounter for immunization: Secondary | ICD-10-CM

## 2019-05-01 NOTE — Progress Notes (Signed)
   Covid-19 Vaccination Clinic  Name:  Brittney Stark    MRN: [id number] DOB: [birth year]  05/01/2019  [name] was observed post Covid-19 immunization for 15 minutes without incident. She was provided with Vaccine Information Sheet and instruction to access the V-Safe system.   [name] was instructed to call 911 with any severe reactions post vaccine: Marland Kitchen Difficulty breathing  . Swelling of face and throat  . A fast heartbeat  . A bad rash all over body  . Dizziness and weakness   Immunizations Administered    Name Date Dose VIS Date Route   Pfizer COVID-19 Vaccine 05/01/2019  4:08 PM 0.3 mL 01/11/2019 Intramuscular   Manufacturer: [place]   Lot: [id number]   [place]: [id number]

## 2019-06-12 DIAGNOSIS — M1711 Unilateral primary osteoarthritis, right knee: Secondary | ICD-10-CM | POA: Diagnosis not present

## 2019-06-12 DIAGNOSIS — M25561 Pain in right knee: Secondary | ICD-10-CM | POA: Diagnosis not present

## 2019-06-12 DIAGNOSIS — M21161 Varus deformity, not elsewhere classified, right knee: Secondary | ICD-10-CM | POA: Diagnosis not present

## 2019-06-20 DIAGNOSIS — H16213 Exposure keratoconjunctivitis, bilateral: Secondary | ICD-10-CM | POA: Diagnosis not present

## 2019-06-20 DIAGNOSIS — H2513 Age-related nuclear cataract, bilateral: Secondary | ICD-10-CM | POA: Diagnosis not present

## 2019-06-20 DIAGNOSIS — H40013 Open angle with borderline findings, low risk, bilateral: Secondary | ICD-10-CM | POA: Diagnosis not present

## 2019-08-14 DIAGNOSIS — M1711 Unilateral primary osteoarthritis, right knee: Secondary | ICD-10-CM | POA: Diagnosis not present

## 2019-08-14 DIAGNOSIS — Q6631 Other congenital varus deformities of feet, right foot: Secondary | ICD-10-CM | POA: Diagnosis not present

## 2019-08-22 ENCOUNTER — Telehealth: Payer: Self-pay | Admitting: Family Medicine

## 2019-08-22 DIAGNOSIS — M1711 Unilateral primary osteoarthritis, right knee: Secondary | ICD-10-CM | POA: Diagnosis not present

## 2019-08-22 NOTE — Telephone Encounter (Signed)
Patient dropped of Surgery Clearance form, put the form in Dr. Rogers Blocker folder. Let the patient know she would need appt, patient said she call when she had her calender.

## 2019-08-22 NOTE — Telephone Encounter (Signed)
FYI

## 2019-09-05 ENCOUNTER — Ambulatory Visit: Payer: Medicare Other | Admitting: Family Medicine

## 2019-09-06 ENCOUNTER — Telehealth: Payer: Self-pay | Admitting: Family Medicine

## 2019-09-06 ENCOUNTER — Ambulatory Visit: Payer: Medicare Other | Admitting: Family Medicine

## 2019-09-06 NOTE — Telephone Encounter (Signed)
Please see message below, and schedule appt with pt.   Thank You

## 2019-09-06 NOTE — Telephone Encounter (Signed)
Patient called and stated that she dropped paperwork off for Dr. Rogers Blocker to complete and fax back for surgery but office still hasn't received forms yet, please advise. CB is [phone]

## 2019-09-06 NOTE — Telephone Encounter (Signed)
She has to have an appointment as has been discussed. She had an appointment this week that she no showed. Needs to be seen in office for me to do a pre op clearance. Must have labs done per surgeon request.   Thanks,  Dr. Rogers Blocker

## 2019-09-09 NOTE — Telephone Encounter (Signed)
Patient has been scheduled

## 2019-09-11 ENCOUNTER — Ambulatory Visit (INDEPENDENT_AMBULATORY_CARE_PROVIDER_SITE_OTHER): Payer: Medicare Other | Admitting: Family Medicine

## 2019-09-11 ENCOUNTER — Encounter: Payer: Self-pay | Admitting: Family Medicine

## 2019-09-11 ENCOUNTER — Other Ambulatory Visit: Payer: Self-pay

## 2019-09-11 VITALS — BP 120/70 | HR 63 | Temp 97.3°F | Ht 68.0 in | Wt 148.2 lb

## 2019-09-11 DIAGNOSIS — Z419 Encounter for procedure for purposes other than remedying health state, unspecified: Secondary | ICD-10-CM

## 2019-09-11 DIAGNOSIS — Z01818 Encounter for other preprocedural examination: Secondary | ICD-10-CM | POA: Diagnosis not present

## 2019-09-11 DIAGNOSIS — R739 Hyperglycemia, unspecified: Secondary | ICD-10-CM | POA: Diagnosis not present

## 2019-09-11 NOTE — Progress Notes (Signed)
Patient: Brittney Stark MRN: [id number] DOB: 1950/08/13 PCP: Brittney Flaming, MD     Subjective:  Chief Complaint  Patient presents with  . Procedure    Surgical Clearance    HPI: The patient is a 69 y.o. female who presents today for Surgical Clearance. 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 She is overall very healthy with no medical problems.   Review of Systems  Constitutional: Negative for chills, fatigue and fever.  HENT: Negative for dental problem, ear pain, hearing loss and trouble swallowing.   Eyes: Negative for visual disturbance.  Respiratory: Negative for cough, chest tightness, shortness of breath and wheezing.   Cardiovascular: Negative for chest pain, palpitations and leg swelling.  Gastrointestinal: Negative for abdominal pain, blood in stool, diarrhea and nausea.  Endocrine: Negative for cold intolerance, polydipsia, polyphagia and polyuria.  Genitourinary: Negative for dysuria and hematuria.  Musculoskeletal: Negative for arthralgias.  Skin: Negative for rash.  Neurological: Negative for dizziness and headaches.  Psychiatric/Behavioral: Negative for dysphoric mood and sleep disturbance. The patient is not nervous/anxious.     Allergies Patient has No Known Allergies.  Past Medical History Patient  has a past medical history of Allergy, Arthritis, and Seasonal allergies.  Surgical History Patient  has a past surgical history that includes Wisdom tooth extraction; Total vaginal hysterectomy; and Colonoscopy.  Family History Pateint's family history includes Colon polyps in her brother; Diabetes in her mother; Heart disease in her mother; Stroke in her mother.  Social History Patient  reports that she has never smoked. She has never used smokeless tobacco. She reports current alcohol use of about 3.0 standard drinks of alcohol per week. She reports that she does not use drugs.    Objective: Vitals:   09/11/19 1259  BP: 120/70  Pulse: 63   Temp: (!) 97.3 F (36.3 C)  TempSrc: Temporal  SpO2: 97%  Weight: 148 lb 3.2 oz (67.2 kg)  Height: 5\' 8"  (1.727 m)    Body mass index is 22.53 kg/m.  Physical Exam Vitals reviewed.  Constitutional:      Appearance: Normal appearance. She is well-developed and normal weight.  HENT:     Head: Normocephalic and atraumatic.     Right Ear: External ear normal.     Left Ear: External ear normal.     Mouth/Throat:     Mouth: Mucous membranes are moist.  Eyes:     Extraocular Movements: Extraocular movements intact.     Conjunctiva/sclera: Conjunctivae normal.     Pupils: Pupils are equal, round, and reactive to light.  Neck:     Thyroid: No thyromegaly.  Cardiovascular:     Rate and Rhythm: Normal rate and regular rhythm.     Pulses: Normal pulses.     Heart sounds: Normal heart sounds. No murmur heard.   Pulmonary:     Effort: Pulmonary effort is normal.     Breath sounds: Normal breath sounds.  Abdominal:     General: Abdomen is flat. Bowel sounds are normal. There is no distension.     Palpations: Abdomen is soft.     Tenderness: There is no abdominal tenderness.  Musculoskeletal:     Cervical back: Normal range of motion and neck supple.  Lymphadenopathy:     Cervical: No cervical adenopathy.  Skin:    General: Skin is warm and dry.     Capillary Refill: Capillary refill takes less than 2 seconds.     Findings: No rash.  Neurological:  General: No focal deficit present.     Mental Status: She is alert and oriented to person, place, and time.     Cranial Nerves: No cranial nerve deficit.     Coordination: Coordination normal.     Deep Tendon Reflexes: Reflexes normal.  Psychiatric:        Mood and Affect: Mood normal.        Behavior: Behavior normal.    Ekg: sinus brady with rate of 57    Assessment/plan: 1. Preoperative clearance Cleared from medical and cardiac standpoint. Labs and ekg done today.  - CBC with Differential/Platelet; Future -  Comprehensive metabolic panel; Future -EKG  3. Elevated blood sugar  - Hemoglobin A1c; Future   This visit occurred during the SARS-CoV-2 public health emergency.  Safety protocols were in place, including screening questions prior to the visit, additional usage of staff PPE, and extensive cleaning of exam room while observing appropriate contact time as indicated for disinfecting solutions.     Return if symptoms worsen or fail to improve.   Brittney Flaming, MD [place]   09/11/2019

## 2019-09-12 ENCOUNTER — Telehealth: Payer: Self-pay | Admitting: Family Medicine

## 2019-09-12 NOTE — Telephone Encounter (Signed)
Please call patient and let her know she didn't get labs yesterday that I can tell???? Need them for surgery so she will need to come back by for these! Thanks! Dr. Rogers Blocker

## 2019-09-12 NOTE — Telephone Encounter (Signed)
Called pt to give message below. Please schedule pt for labs.  Thank You

## 2019-09-13 ENCOUNTER — Other Ambulatory Visit: Payer: Medicare Other

## 2019-09-13 ENCOUNTER — Other Ambulatory Visit: Payer: Self-pay

## 2019-09-13 DIAGNOSIS — Z01818 Encounter for other preprocedural examination: Secondary | ICD-10-CM | POA: Diagnosis not present

## 2019-09-13 DIAGNOSIS — R739 Hyperglycemia, unspecified: Secondary | ICD-10-CM | POA: Diagnosis not present

## 2019-09-14 LAB — COMPREHENSIVE METABOLIC PANEL
AG Ratio: 2.6 (calc) — ABNORMAL HIGH (ref 1.0–2.5)
ALT: 13 U/L (ref 6–29)
AST: 18 U/L (ref 10–35)
Albumin: 4.6 g/dL (ref 3.6–5.1)
Alkaline phosphatase (APISO): 49 U/L (ref 37–153)
BUN: 18 mg/dL (ref 7–25)
CO2: 28 mmol/L (ref 20–32)
Calcium: 9.3 mg/dL (ref 8.6–10.4)
Chloride: 104 mmol/L (ref 98–110)
Creat: 0.99 mg/dL (ref 0.50–0.99)
Globulin: 1.8 g/dL (calc) — ABNORMAL LOW (ref 1.9–3.7)
Glucose, Bld: 133 mg/dL — ABNORMAL HIGH (ref 65–99)
Potassium: 4 mmol/L (ref 3.5–5.3)
Sodium: 140 mmol/L (ref 135–146)
Total Bilirubin: 0.6 mg/dL (ref 0.2–1.2)
Total Protein: 6.4 g/dL (ref 6.1–8.1)

## 2019-09-14 LAB — CBC WITH DIFFERENTIAL/PLATELET
Absolute Monocytes: 333 cells/uL (ref 200–950)
Basophils Absolute: 41 cells/uL (ref 0–200)
Basophils Relative: 0.9 %
Eosinophils Absolute: 99 cells/uL (ref 15–500)
Eosinophils Relative: 2.2 %
HCT: 40.1 % (ref 35.0–45.0)
Hemoglobin: 13.4 g/dL (ref 11.7–15.5)
Lymphs Abs: 1436 cells/uL (ref 850–3900)
MCH: 34.3 pg — ABNORMAL HIGH (ref 27.0–33.0)
MCHC: 33.4 g/dL (ref 32.0–36.0)
MCV: 102.6 fL — ABNORMAL HIGH (ref 80.0–100.0)
MPV: 9.8 fL (ref 7.5–12.5)
Monocytes Relative: 7.4 %
Neutro Abs: 2592 cells/uL (ref 1500–7800)
Neutrophils Relative %: 57.6 %
Platelets: 248 10*3/uL (ref 140–400)
RBC: 3.91 10*6/uL (ref 3.80–5.10)
RDW: 12.3 % (ref 11.0–15.0)
Total Lymphocyte: 31.9 %
WBC: 4.5 10*3/uL (ref 3.8–10.8)

## 2019-09-14 LAB — HEMOGLOBIN A1C
Hgb A1c MFr Bld: 5.2 % of total Hgb (ref <5.7)
Mean Plasma Glucose: 103 (calc)
eAG (mmol/L): 5.7 (calc)

## 2019-10-14 DIAGNOSIS — M1711 Unilateral primary osteoarthritis, right knee: Secondary | ICD-10-CM | POA: Diagnosis not present

## 2019-10-14 DIAGNOSIS — G8918 Other acute postprocedural pain: Secondary | ICD-10-CM | POA: Diagnosis not present

## 2019-10-21 DIAGNOSIS — R2689 Other abnormalities of gait and mobility: Secondary | ICD-10-CM | POA: Diagnosis not present

## 2019-10-21 DIAGNOSIS — R29898 Other symptoms and signs involving the musculoskeletal system: Secondary | ICD-10-CM | POA: Diagnosis not present

## 2019-10-21 DIAGNOSIS — Z96651 Presence of right artificial knee joint: Secondary | ICD-10-CM | POA: Diagnosis not present

## 2019-10-21 DIAGNOSIS — Z789 Other specified health status: Secondary | ICD-10-CM | POA: Diagnosis not present

## 2019-10-21 DIAGNOSIS — M25561 Pain in right knee: Secondary | ICD-10-CM | POA: Diagnosis not present

## 2019-10-22 DIAGNOSIS — Z96651 Presence of right artificial knee joint: Secondary | ICD-10-CM | POA: Insufficient documentation

## 2019-10-23 DIAGNOSIS — Z96651 Presence of right artificial knee joint: Secondary | ICD-10-CM | POA: Diagnosis not present

## 2019-10-28 DIAGNOSIS — M25561 Pain in right knee: Secondary | ICD-10-CM | POA: Diagnosis not present

## 2019-10-28 DIAGNOSIS — Z789 Other specified health status: Secondary | ICD-10-CM | POA: Diagnosis not present

## 2019-10-28 DIAGNOSIS — R29898 Other symptoms and signs involving the musculoskeletal system: Secondary | ICD-10-CM | POA: Diagnosis not present

## 2019-10-28 DIAGNOSIS — R2689 Other abnormalities of gait and mobility: Secondary | ICD-10-CM | POA: Diagnosis not present

## 2019-10-28 DIAGNOSIS — Z96651 Presence of right artificial knee joint: Secondary | ICD-10-CM | POA: Diagnosis not present

## 2019-10-30 DIAGNOSIS — R29898 Other symptoms and signs involving the musculoskeletal system: Secondary | ICD-10-CM | POA: Diagnosis not present

## 2019-10-30 DIAGNOSIS — Z96651 Presence of right artificial knee joint: Secondary | ICD-10-CM | POA: Diagnosis not present

## 2019-10-30 DIAGNOSIS — R2689 Other abnormalities of gait and mobility: Secondary | ICD-10-CM | POA: Diagnosis not present

## 2019-10-30 DIAGNOSIS — Z789 Other specified health status: Secondary | ICD-10-CM | POA: Diagnosis not present

## 2019-10-30 DIAGNOSIS — M25561 Pain in right knee: Secondary | ICD-10-CM | POA: Diagnosis not present

## 2019-11-04 DIAGNOSIS — M25561 Pain in right knee: Secondary | ICD-10-CM | POA: Diagnosis not present

## 2019-11-04 DIAGNOSIS — Z96651 Presence of right artificial knee joint: Secondary | ICD-10-CM | POA: Diagnosis not present

## 2019-11-04 DIAGNOSIS — R2689 Other abnormalities of gait and mobility: Secondary | ICD-10-CM | POA: Diagnosis not present

## 2019-11-04 DIAGNOSIS — R29898 Other symptoms and signs involving the musculoskeletal system: Secondary | ICD-10-CM | POA: Diagnosis not present

## 2019-11-04 DIAGNOSIS — Z789 Other specified health status: Secondary | ICD-10-CM | POA: Diagnosis not present

## 2019-11-06 DIAGNOSIS — Z96651 Presence of right artificial knee joint: Secondary | ICD-10-CM | POA: Diagnosis not present

## 2019-11-06 DIAGNOSIS — Z789 Other specified health status: Secondary | ICD-10-CM | POA: Diagnosis not present

## 2019-11-06 DIAGNOSIS — R2689 Other abnormalities of gait and mobility: Secondary | ICD-10-CM | POA: Diagnosis not present

## 2019-11-06 DIAGNOSIS — R29898 Other symptoms and signs involving the musculoskeletal system: Secondary | ICD-10-CM | POA: Diagnosis not present

## 2019-11-06 DIAGNOSIS — M25561 Pain in right knee: Secondary | ICD-10-CM | POA: Diagnosis not present

## 2019-11-13 DIAGNOSIS — R2689 Other abnormalities of gait and mobility: Secondary | ICD-10-CM | POA: Diagnosis not present

## 2019-11-13 DIAGNOSIS — M25561 Pain in right knee: Secondary | ICD-10-CM | POA: Diagnosis not present

## 2019-11-13 DIAGNOSIS — Z789 Other specified health status: Secondary | ICD-10-CM | POA: Diagnosis not present

## 2019-11-13 DIAGNOSIS — R29898 Other symptoms and signs involving the musculoskeletal system: Secondary | ICD-10-CM | POA: Diagnosis not present

## 2019-11-13 DIAGNOSIS — Z96651 Presence of right artificial knee joint: Secondary | ICD-10-CM | POA: Diagnosis not present

## 2019-11-18 ENCOUNTER — Telehealth: Payer: Self-pay

## 2019-11-18 DIAGNOSIS — Z789 Other specified health status: Secondary | ICD-10-CM | POA: Diagnosis not present

## 2019-11-18 DIAGNOSIS — R29898 Other symptoms and signs involving the musculoskeletal system: Secondary | ICD-10-CM | POA: Diagnosis not present

## 2019-11-18 DIAGNOSIS — M25561 Pain in right knee: Secondary | ICD-10-CM | POA: Diagnosis not present

## 2019-11-18 DIAGNOSIS — Z96651 Presence of right artificial knee joint: Secondary | ICD-10-CM | POA: Diagnosis not present

## 2019-11-18 DIAGNOSIS — R2689 Other abnormalities of gait and mobility: Secondary | ICD-10-CM | POA: Diagnosis not present

## 2019-11-18 NOTE — Telephone Encounter (Signed)
Pt came into office with a bill from [facility] 09/05/2019. Pt states she was charged a no show fee. Pt appt was on 09/05/2019. Pt states she had 09/06/2019 instead of the correct date. Pt came to office and they told her the appt was yesterday. Pt states she never got a reminder call or email to remind her of the appt. Pt does not have MyChart. Pt is asking for the no show fee to be removed. Please advise.

## 2019-11-20 DIAGNOSIS — Z789 Other specified health status: Secondary | ICD-10-CM | POA: Diagnosis not present

## 2019-11-20 DIAGNOSIS — Z96651 Presence of right artificial knee joint: Secondary | ICD-10-CM | POA: Diagnosis not present

## 2019-11-20 DIAGNOSIS — R2689 Other abnormalities of gait and mobility: Secondary | ICD-10-CM | POA: Diagnosis not present

## 2019-11-20 DIAGNOSIS — M25561 Pain in right knee: Secondary | ICD-10-CM | POA: Diagnosis not present

## 2019-11-20 DIAGNOSIS — R29898 Other symptoms and signs involving the musculoskeletal system: Secondary | ICD-10-CM | POA: Diagnosis not present

## 2019-11-22 NOTE — Telephone Encounter (Signed)
I have submitted for this to be voided.  I have informed the patient.

## 2019-11-25 DIAGNOSIS — R2689 Other abnormalities of gait and mobility: Secondary | ICD-10-CM | POA: Diagnosis not present

## 2019-11-25 DIAGNOSIS — Z96651 Presence of right artificial knee joint: Secondary | ICD-10-CM | POA: Diagnosis not present

## 2019-11-25 DIAGNOSIS — R29898 Other symptoms and signs involving the musculoskeletal system: Secondary | ICD-10-CM | POA: Diagnosis not present

## 2019-11-25 DIAGNOSIS — M25561 Pain in right knee: Secondary | ICD-10-CM | POA: Diagnosis not present

## 2019-11-25 DIAGNOSIS — Z789 Other specified health status: Secondary | ICD-10-CM | POA: Diagnosis not present

## 2019-11-28 DIAGNOSIS — R29898 Other symptoms and signs involving the musculoskeletal system: Secondary | ICD-10-CM | POA: Diagnosis not present

## 2019-11-28 DIAGNOSIS — M25561 Pain in right knee: Secondary | ICD-10-CM | POA: Diagnosis not present

## 2019-11-28 DIAGNOSIS — R2689 Other abnormalities of gait and mobility: Secondary | ICD-10-CM | POA: Diagnosis not present

## 2019-11-28 DIAGNOSIS — Z789 Other specified health status: Secondary | ICD-10-CM | POA: Diagnosis not present

## 2019-11-28 DIAGNOSIS — Z96651 Presence of right artificial knee joint: Secondary | ICD-10-CM | POA: Diagnosis not present

## 2019-12-04 DIAGNOSIS — R29898 Other symptoms and signs involving the musculoskeletal system: Secondary | ICD-10-CM | POA: Diagnosis not present

## 2019-12-04 DIAGNOSIS — Z789 Other specified health status: Secondary | ICD-10-CM | POA: Diagnosis not present

## 2019-12-04 DIAGNOSIS — Z96651 Presence of right artificial knee joint: Secondary | ICD-10-CM | POA: Diagnosis not present

## 2019-12-04 DIAGNOSIS — M25561 Pain in right knee: Secondary | ICD-10-CM | POA: Diagnosis not present

## 2019-12-04 DIAGNOSIS — R2689 Other abnormalities of gait and mobility: Secondary | ICD-10-CM | POA: Diagnosis not present

## 2019-12-06 DIAGNOSIS — R2689 Other abnormalities of gait and mobility: Secondary | ICD-10-CM | POA: Diagnosis not present

## 2019-12-06 DIAGNOSIS — Z789 Other specified health status: Secondary | ICD-10-CM | POA: Diagnosis not present

## 2019-12-06 DIAGNOSIS — M25561 Pain in right knee: Secondary | ICD-10-CM | POA: Diagnosis not present

## 2019-12-06 DIAGNOSIS — R29898 Other symptoms and signs involving the musculoskeletal system: Secondary | ICD-10-CM | POA: Diagnosis not present

## 2019-12-06 DIAGNOSIS — Z96651 Presence of right artificial knee joint: Secondary | ICD-10-CM | POA: Diagnosis not present

## 2019-12-09 DIAGNOSIS — M25561 Pain in right knee: Secondary | ICD-10-CM | POA: Diagnosis not present

## 2019-12-09 DIAGNOSIS — Z96651 Presence of right artificial knee joint: Secondary | ICD-10-CM | POA: Diagnosis not present

## 2019-12-09 DIAGNOSIS — R29898 Other symptoms and signs involving the musculoskeletal system: Secondary | ICD-10-CM | POA: Diagnosis not present

## 2019-12-11 DIAGNOSIS — Z789 Other specified health status: Secondary | ICD-10-CM | POA: Diagnosis not present

## 2019-12-11 DIAGNOSIS — R2689 Other abnormalities of gait and mobility: Secondary | ICD-10-CM | POA: Diagnosis not present

## 2019-12-11 DIAGNOSIS — Z96651 Presence of right artificial knee joint: Secondary | ICD-10-CM | POA: Diagnosis not present

## 2019-12-11 DIAGNOSIS — M25561 Pain in right knee: Secondary | ICD-10-CM | POA: Diagnosis not present

## 2019-12-11 DIAGNOSIS — R29898 Other symptoms and signs involving the musculoskeletal system: Secondary | ICD-10-CM | POA: Diagnosis not present

## 2019-12-16 DIAGNOSIS — Z789 Other specified health status: Secondary | ICD-10-CM | POA: Diagnosis not present

## 2019-12-16 DIAGNOSIS — R2689 Other abnormalities of gait and mobility: Secondary | ICD-10-CM | POA: Diagnosis not present

## 2019-12-16 DIAGNOSIS — R29898 Other symptoms and signs involving the musculoskeletal system: Secondary | ICD-10-CM | POA: Diagnosis not present

## 2019-12-16 DIAGNOSIS — M25561 Pain in right knee: Secondary | ICD-10-CM | POA: Diagnosis not present

## 2019-12-16 DIAGNOSIS — Z96651 Presence of right artificial knee joint: Secondary | ICD-10-CM | POA: Diagnosis not present

## 2019-12-18 DIAGNOSIS — R29898 Other symptoms and signs involving the musculoskeletal system: Secondary | ICD-10-CM | POA: Diagnosis not present

## 2019-12-18 DIAGNOSIS — Z96651 Presence of right artificial knee joint: Secondary | ICD-10-CM | POA: Diagnosis not present

## 2019-12-18 DIAGNOSIS — R2689 Other abnormalities of gait and mobility: Secondary | ICD-10-CM | POA: Diagnosis not present

## 2019-12-18 DIAGNOSIS — Z789 Other specified health status: Secondary | ICD-10-CM | POA: Diagnosis not present

## 2019-12-18 DIAGNOSIS — M25561 Pain in right knee: Secondary | ICD-10-CM | POA: Diagnosis not present

## 2020-01-06 DIAGNOSIS — H1131 Conjunctival hemorrhage, right eye: Secondary | ICD-10-CM | POA: Diagnosis not present

## 2020-02-07 ENCOUNTER — Other Ambulatory Visit: Payer: Self-pay

## 2020-02-07 ENCOUNTER — Ambulatory Visit (INDEPENDENT_AMBULATORY_CARE_PROVIDER_SITE_OTHER): Payer: Medicare Other

## 2020-02-07 VITALS — BP 118/76 | HR 69 | Resp 20 | Wt 157.8 lb

## 2020-02-07 DIAGNOSIS — Z0000 Encounter for general adult medical examination without abnormal findings: Secondary | ICD-10-CM

## 2020-02-07 NOTE — Progress Notes (Signed)
.Virtual Visit via Telephone Note  I connected with  Brittney Stark on 02/07/20 at 11:45 AM EST by telephone and verified that I am speaking with the correct person using two identifiers.  Medicare Annual Wellness visit completed telephonically due to Covid-19 pandemic.   Persons participating in this call: This Health Coach and this patient.   Location: Patient: Home Provider: Office   I discussed the limitations, risks, security and privacy concerns of performing an evaluation and management service by telephone and the availability of in person appointments. The patient expressed understanding and agreed to proceed.  Unable to perform video visit due to video visit attempted and failed and/or patient does not have video capability.   Some vital signs may be absent or patient reported.   Willette Brace, LPN    Subjective:   Brittney Stark is a 70 y.o. female who presents for Medicare Annual (Subsequent) preventive examination.  Review of Systems     Cardiac Risk Factors include: advanced age (>47men, >80 women)     Objective:    Today's Vitals   02/07/20 1151  BP: 118/76  Pulse: 69  Resp: 20  SpO2: 96%  Weight: 157 lb 12.8 oz (71.6 kg)   Body mass index is 23.99 kg/m.  Advanced Directives 02/07/2020  Does Patient Have a Medical Advance Directive? Yes  Type of Paramedic of [place];Living will  Copy of [place] in Chart? No - copy requested    Current Medications (verified) Outpatient Encounter Medications as of 02/07/2020  Medication Sig  . [DISCONTINUED] Azelastine-Fluticasone 137-50 MCG/ACT SUSP Place 1 spray into the nose 2 (two) times daily. (Patient not taking: No sig reported)   No facility-administered encounter medications on file as of 02/07/2020.    Allergies (verified) Patient has no known allergies.   History: Past Medical History:  Diagnosis Date  . Allergy   . Arthritis   . Seasonal allergies    Past  Surgical History:  Procedure Laterality Date  . COLONOSCOPY    . TOTAL VAGINAL HYSTERECTOMY    . WISDOM TOOTH EXTRACTION     Family History  Problem Relation Age of Onset  . Stroke Mother   . Heart disease Mother   . Diabetes Mother   . Colon polyps Brother   . Colon cancer Neg Hx   . Esophageal cancer Neg Hx   . Rectal cancer Neg Hx   . Stomach cancer Neg Hx    Social History   Socioeconomic History  . Marital status: Unknown    Spouse name: Not on file  . Number of children: Not on file  . Years of education: Not on file  . Highest education level: Not on file  Occupational History  . Occupation: semi retired   Tobacco Use  . Smoking status: Never Smoker  . Smokeless tobacco: Never Used  Substance and Sexual Activity  . Alcohol use: Yes    Alcohol/week: 3.0 standard drinks    Types: 3 Glasses of wine per week  . Drug use: No  . Sexual activity: Never  Other Topics Concern  . Not on file  Social History Narrative   Lost > 65 pounds using [employer].    Social Determinants of Health   Financial Resource Strain: Low Risk   . Difficulty of Paying Living Expenses: Not hard at all  Food Insecurity: No Food Insecurity  . Worried About Charity fundraiser in the Last Year: Never true  .  Ran Out of Food in the Last Year: Never true  Transportation Needs: No Transportation Needs  . Lack of Transportation (Medical): No  . Lack of Transportation (Non-Medical): No  Physical Activity: Sufficiently Active  . Days of Exercise per Week: 7 days  . Minutes of Exercise per Session: 60 min  Stress: No Stress Concern Present  . Feeling of Stress : Not at all  Social Connections: Moderately Isolated  . Frequency of Communication with Friends and Family: Twice a week  . Frequency of Social Gatherings with Friends and Family: Once a week  . Attends Religious Services: Never  . Active Member of Clubs or Organizations: Yes  . Attends Archivist Meetings: 1 to 4 times  per year  . Marital Status: Never married    Tobacco Counseling Counseling given: Not Answered   Clinical Intake:  Pre-visit preparation completed: Yes  Pain : No/denies pain     BMI - recorded: 22.54 Nutritional Status: BMI of 19-24  Normal Nutritional Risks: None Diabetes: No  How often do you need to have someone help you when you read instructions, pamphlets, or other written materials from your doctor or pharmacy?: 1 - Never  Diabetic?No  Interpreter Needed?: No  Information entered by :: [name], LPN   Activities of Daily Living In your present state of health, do you have any difficulty performing the following activities: 02/07/2020  Hearing? N  Vision? N  Difficulty concentrating or making decisions? N  Walking or climbing stairs? N  Dressing or bathing? N  Doing errands, shopping? N  Preparing Food and eating ? N  Using the Toilet? N  In the past six months, have you accidently leaked urine? N  Do you have problems with loss of bowel control? N  Managing your Medications? N  Managing your Finances? N  Housekeeping or managing your Housekeeping? N  Some recent data might be hidden    Patient Care Team: Orma Flaming, MD as PCP - General (Family Medicine) Tiajuana Amass, MD as Referring Physician (Allergy and Immunology)  Indicate any recent Medical Services you may have received from other than [facility] providers in the past year (date may be approximate).     Assessment:   This is a routine wellness examination for Brittney Stark.  Hearing/Vision screen  Hearing Screening   125Hz  250Hz  500Hz  1000Hz  2000Hz  3000Hz  4000Hz  6000Hz  8000Hz   Right ear:           Left ear:           Comments: Pt denies any hearing issues  Vision Screening Comments: Pt follows up with Dr Katy Fitch for annual eye care  Dietary issues and exercise activities discussed: Current Exercise Habits: Home exercise routine, Type of exercise: walking, Time (Minutes): 60, Frequency  (Times/Week): 7, Weekly Exercise (Minutes/Week): 420  Goals    . Patient Stated     LOSE 10LBS       Depression Screen PHQ 2/9 Scores 02/07/2020 09/11/2019 01/04/2019 01/04/2019 12/30/2016 08/28/2015  PHQ - 2 Score 0 0 0 0 0 0    Fall Risk Fall Risk  02/07/2020 09/11/2019 01/04/2019 09/03/2018 12/30/2016  Falls in the past year? 0 0 0 (No Data) No  Comment - - - Emmi Telephone Survey: data to providers prior to load -  Number falls in past yr: 0 - 0 (No Data) -  Comment - - - Emmi Telephone Survey Actual Response =  -  Injury with Fall? 0 - 0 - -  Risk for fall  due to : Impaired vision No Fall Risks - - -  Follow up Falls prevention discussed - - - -    FALL RISK PREVENTION PERTAINING TO THE HOME:  Any stairs in or around the home? Yes  If so, are there any without handrails? No  Home free of loose throw rugs in walkways, pet beds, electrical cords, etc? Yes  Adequate lighting in your home to reduce risk of falls? Yes   ASSISTIVE DEVICES UTILIZED TO PREVENT FALLS:  Life alert? No  Use of a cane, walker or w/c? No  Grab bars in the bathroom? No  Shower chair or bench in shower? Yes  Elevated toilet seat or a handicapped toilet? Yes   TIMED UP AND GO:  Was the test performed? Yes .  Length of time to ambulate 10 feet: 10 sec.   Gait steady and fast without use of assistive device  Cognitive Function:     6CIT Screen 02/07/2020  What Year? 0 points  What month? 0 points  Count back from 20 0 points  Months in reverse 0 points  Repeat phrase 0 points    Immunizations Immunization History  Administered Date(s) Administered  . PFIZER SARS-COV-2 Vaccination 04/04/2019, 05/01/2019  . Tdap 08/06/2017    TDAP status: Up to date  Flu Vaccine status: Declined, Education has been provided regarding the importance of this vaccine but patient still declined. Advised may receive this vaccine at local pharmacy or Health Dept. Aware to provide a copy of the vaccination record if  obtained from local pharmacy or Health Dept. Verbalized acceptance and understanding.  Pneumococcal vaccine status: Declined,  Education has been provided regarding the importance of this vaccine but patient still declined. Advised may receive this vaccine at local pharmacy or Health Dept. Aware to provide a copy of the vaccination record if obtained from local pharmacy or Health Dept. Verbalized acceptance and understanding.   Covid-19 vaccine status: Completed vaccines  Qualifies for Shingles Vaccine? Yes   Zostavax completed No   Shingrix Completed?: No.    Education has been provided regarding the importance of this vaccine. Patient has been advised to call insurance company to determine out of pocket expense if they have not yet received this vaccine. Advised may also receive vaccine at local pharmacy or Health Dept. Verbalized acceptance and understanding.  Screening Tests Health Maintenance  Topic Date Due  . MAMMOGRAM  02/06/2021 (Originally 02/16/2019)  . COLONOSCOPY (Pts 45-17yrs Insurance coverage will need to be confirmed)  02/06/2022  . TETANUS/TDAP  08/07/2027  . DEXA SCAN  Completed  . Hepatitis C Screening  Completed  . INFLUENZA VACCINE  Discontinued  . COVID-19 Vaccine  Discontinued  . PNA vac Low Risk Adult  Discontinued    Health Maintenance  There are no preventive care reminders to display for this patient.  Colorectal cancer screening: Type of screening: Colonoscopy. Completed 02/06/17. Repeat every 5 years  Mammogram status: Completed 02/15/17. Repeat every year  Bone Density status: Completed 02/15/17. Results reflect: Bone density results: OSTEOPENIA. Repeat every 2 years.   Additional Screening:  Hepatitis C Screening:  Completed 01/04/19  Vision Screening: Recommended annual ophthalmology exams for early detection of glaucoma and other disorders of the eye. Is the patient up to date with their annual eye exam?  Yes  Who is the provider or what is the name  of the office in which the patient attends annual eye exams?Dr Dione Booze   Dental Screening: Recommended annual dental exams for proper oral hygiene  Community Resource Referral / Chronic Care Management: CRR required this visit?  No   CCM required this visit?  No      Plan:     I have personally reviewed and noted the following in the patient's chart:   . Medical and social history . Use of alcohol, tobacco or illicit drugs  . Current medications and supplements . Functional ability and status . Nutritional status . Physical activity . Advanced directives . List of other physicians . Hospitalizations, surgeries, and ER visits in previous 12 months . Vitals . Screenings to include cognitive, depression, and falls . Referrals and appointments  In addition, I have reviewed and discussed with patient certain preventive protocols, quality metrics, and best practice recommendations. A written personalized care plan for preventive services as well as general preventive health recommendations were provided to patient.     Marzella Schlein, LPN   05/06/386   Nurse Notes: None

## 2020-02-07 NOTE — Patient Instructions (Addendum)
Ms. Brittney Stark , Thank you for taking time to come for your Medicare Wellness Visit. I appreciate your ongoing commitment to your health goals. Please review the following plan we discussed and let me know if I can assist you in the future.   Screening recommendations/referrals: Colonoscopy: Done 02/06/17 Mammogram: Done 02/15/17 Bone Density: Done 02/15/17 Recommended yearly ophthalmology/optometry visit for glaucoma screening and checkup Recommended yearly dental visit for hygiene and checkup  Vaccinations: Influenza vaccine: Declined and discussed Pneumococcal vaccine: Declined and discussed Tdap vaccine: Up to date Shingles vaccine: Shingrix discussed. Please contact your pharmacy for coverage information.    Covid-19:Completed 3/4 & 05/01/19  Advanced directives: Please bring a copy of your health care power of attorney and living will to the office at your convenience.  Conditions/risks identified: lose weight   Next appointment: Follow up in one year for your annual wellness visit    Preventive Care 65 Years and Older, Female Preventive care refers to lifestyle choices and visits with your health care provider that can promote health and wellness. What does preventive care include?  A yearly physical exam. This is also called an annual well check.  Dental exams once or twice a year.  Routine eye exams. Ask your health care provider how often you should have your eyes checked.  Personal lifestyle choices, including:  Daily care of your teeth and gums.  Regular physical activity.  Eating a healthy diet.  Avoiding tobacco and drug use.  Limiting alcohol use.  Practicing safe sex.  Taking low-dose aspirin every day.  Taking vitamin and mineral supplements as recommended by your health care provider. What happens during an annual well check? The services and screenings done by your health care provider during your annual well check will depend on your age, overall health,  lifestyle risk factors, and family history of disease. Counseling  Your health care provider may ask you questions about your:  Alcohol use.  Tobacco use.  Drug use.  Emotional well-being.  Home and relationship well-being.  Sexual activity.  Eating habits.  History of falls.  Memory and ability to understand (cognition).  Work and work Statistician.  Reproductive health. Screening  You may have the following tests or measurements:  Height, weight, and BMI.  Blood pressure.  Lipid and cholesterol levels. These may be checked every 5 years, or more frequently if you are over 53 years old.  Skin check.  Lung cancer screening. You may have this screening every year starting at age 48 if you have a 30-pack-year history of smoking and currently smoke or have quit within the past 15 years.  Fecal occult blood test (FOBT) of the stool. You may have this test every year starting at age 35.  Flexible sigmoidoscopy or colonoscopy. You may have a sigmoidoscopy every 5 years or a colonoscopy every 10 years starting at age 48.  Hepatitis C blood test.  Hepatitis B blood test.  Sexually transmitted disease (STD) testing.  Diabetes screening. This is done by checking your blood sugar (glucose) after you have not eaten for a while (fasting). You may have this done every 1-3 years.  Bone density scan. This is done to screen for osteoporosis. You may have this done starting at age 35.  Mammogram. This may be done every 1-2 years. Talk to your health care provider about how often you should have regular mammograms. Talk with your health care provider about your test results, treatment options, and if necessary, the need for more tests. Vaccines  Your health care provider may recommend certain vaccines, such as:  Influenza vaccine. This is recommended every year.  Tetanus, diphtheria, and acellular pertussis (Tdap, Td) vaccine. You may need a Td booster every 10 years.  Zoster  vaccine. You may need this after age 66.  Pneumococcal 13-valent conjugate (PCV13) vaccine. One dose is recommended after age 21.  Pneumococcal polysaccharide (PPSV23) vaccine. One dose is recommended after age 70. Talk to your health care provider about which screenings and vaccines you need and how often you need them. This information is not intended to replace advice given to you by your health care provider. Make sure you discuss any questions you have with your health care provider. Document Released: 02/13/2015 Document Revised: 10/07/2015 Document Reviewed: 11/18/2014 Elsevier Interactive Patient Education  2017 [employer] Prevention in the Home Falls can cause injuries. They can happen to people of all ages. There are many things you can do to make your home safe and to help prevent falls. What can I do on the outside of my home?  Regularly fix the edges of walkways and driveways and fix any cracks.  Remove anything that might make you trip as you walk through a door, such as a raised step or threshold.  Trim any bushes or trees on the path to your home.  Use bright outdoor lighting.  Clear any walking paths of anything that might make someone trip, such as rocks or tools.  Regularly check to see if handrails are loose or broken. Make sure that both sides of any steps have handrails.  Any raised decks and porches should have guardrails on the edges.  Have any leaves, snow, or ice cleared regularly.  Use sand or salt on walking paths during winter.  Clean up any spills in your garage right away. This includes oil or grease spills. What can I do in the bathroom?  Use night lights.  Install grab bars by the toilet and in the tub and shower. Do not use towel bars as grab bars.  Use non-skid mats or decals in the tub or shower.  If you need to sit down in the shower, use a plastic, non-slip stool.  Keep the floor dry. Clean up any water that spills on the  floor as soon as it happens.  Remove soap buildup in the tub or shower regularly.  Attach bath mats securely with double-sided non-slip rug tape.  Do not have throw rugs and other things on the floor that can make you trip. What can I do in the bedroom?  Use night lights.  Make sure that you have a light by your bed that is easy to reach.  Do not use any sheets or blankets that are too big for your bed. They should not hang down onto the floor.  Have a firm chair that has side arms. You can use this for support while you get dressed.  Do not have throw rugs and other things on the floor that can make you trip. What can I do in the kitchen?  Clean up any spills right away.  Avoid walking on wet floors.  Keep items that you use a lot in easy-to-reach places.  If you need to reach something above you, use a strong step stool that has a grab bar.  Keep electrical cords out of the way.  Do not use floor polish or wax that makes floors slippery. If you must use wax, use non-skid floor wax.  Do  not have throw rugs and other things on the floor that can make you trip. What can I do with my stairs?  Do not leave any items on the stairs.  Make sure that there are handrails on both sides of the stairs and use them. Fix handrails that are broken or loose. Make sure that handrails are as long as the stairways.  Check any carpeting to make sure that it is firmly attached to the stairs. Fix any carpet that is loose or worn.  Avoid having throw rugs at the top or bottom of the stairs. If you do have throw rugs, attach them to the floor with carpet tape.  Make sure that you have a light switch at the top of the stairs and the bottom of the stairs. If you do not have them, ask someone to add them for you. What else can I do to help prevent falls?  Wear shoes that:  Do not have high heels.  Have rubber bottoms.  Are comfortable and fit you well.  Are closed at the toe. Do not wear  sandals.  If you use a stepladder:  Make sure that it is fully opened. Do not climb a closed stepladder.  Make sure that both sides of the stepladder are locked into place.  Ask someone to hold it for you, if possible.  Clearly mark and make sure that you can see:  Any grab bars or handrails.  First and last steps.  Where the edge of each step is.  Use tools that help you move around (mobility aids) if they are needed. These include:  Canes.  Walkers.  Scooters.  Crutches.  Turn on the lights when you go into a dark area. Replace any light bulbs as soon as they burn out.  Set up your furniture so you have a clear path. Avoid moving your furniture around.  If any of your floors are uneven, fix them.  If there are any pets around you, be aware of where they are.  Review your medicines with your doctor. Some medicines can make you feel dizzy. This can increase your chance of falling. Ask your doctor what other things that you can do to help prevent falls. This information is not intended to replace advice given to you by your health care provider. Make sure you discuss any questions you have with your health care provider. Document Released: 11/13/2008 Document Revised: 06/25/2015 Document Reviewed: 02/21/2014 Elsevier Interactive Patient Education  2017 [employer].

## 2020-02-27 DIAGNOSIS — Z96651 Presence of right artificial knee joint: Secondary | ICD-10-CM | POA: Diagnosis not present

## 2020-03-04 ENCOUNTER — Encounter: Payer: Self-pay | Admitting: Physician Assistant

## 2020-03-04 ENCOUNTER — Ambulatory Visit (INDEPENDENT_AMBULATORY_CARE_PROVIDER_SITE_OTHER): Payer: Medicare Other | Admitting: Physician Assistant

## 2020-03-04 ENCOUNTER — Other Ambulatory Visit: Payer: Self-pay

## 2020-03-04 VITALS — BP 104/70 | HR 60 | Temp 97.3°F | Resp 16 | Ht 68.0 in | Wt 159.0 lb

## 2020-03-04 DIAGNOSIS — R300 Dysuria: Secondary | ICD-10-CM | POA: Insufficient documentation

## 2020-03-04 LAB — POCT URINALYSIS DIPSTICK
Bilirubin, UA: NEGATIVE
Blood, UA: NEGATIVE
Glucose, UA: NEGATIVE
Ketones, UA: NEGATIVE
Nitrite, UA: NEGATIVE
Protein, UA: NEGATIVE
Spec Grav, UA: 1.01 (ref 1.010–1.025)
Urobilinogen, UA: 0.2 E.U./dL
pH, UA: 7 (ref 5.0–8.0)

## 2020-03-04 NOTE — Patient Instructions (Signed)
I will call with urine culture results and treat if needed. Push fluids this week. Try AZO if symptomatic.

## 2020-03-04 NOTE — Progress Notes (Signed)
   Subjective:    Patient ID: Brittney Stark, female    DOB: 1950-10-19, 70 y.o.   MRN: [id number]  HPI  70 yo female presenting in office today for dysuria and urgency that is coming and going x 1.5 weeks. Denies any pelvic pain or back pain. Has increased water intake. No history of renal stones. No hematuria. No fever, vomiting, or nausea.   Review of Systems  Constitutional: Negative for activity change, appetite change, chills, fatigue and fever.  Gastrointestinal: Negative for abdominal pain, nausea and vomiting.  Genitourinary: Positive for dysuria.  Musculoskeletal: Negative for back pain.       Objective:   Physical Exam Vitals reviewed.  Constitutional:      Appearance: Normal appearance.  Cardiovascular:     Rate and Rhythm: Normal rate and regular rhythm.     Pulses: Normal pulses.     Heart sounds: Normal heart sounds.  Pulmonary:     Effort: Pulmonary effort is normal.     Breath sounds: Normal breath sounds.  Abdominal:     General: Abdomen is flat. Bowel sounds are normal. There is no distension.     Palpations: Abdomen is soft.     Tenderness: There is no abdominal tenderness. There is no right CVA tenderness or left CVA tenderness.  Neurological:     Mental Status: She is alert.     Today's Vitals   03/04/20 0800  BP: 104/70  Pulse: 60  Resp: 16  Temp: (!) 97.3 F (36.3 C)  TempSrc: Temporal  SpO2: 95%  Weight: 159 lb (72.1 kg)  Height: 5\' 8"  (1.727 m)   Body mass index is 24.18 kg/m.  Urinalysis    Component Value Date/Time   BILIRUBINUR negeative 03/04/2020 0823   PROTEINUR Negative 03/04/2020 0823   UROBILINOGEN 0.2 03/04/2020 0823   NITRITE negative 03/04/2020 0823   LEUKOCYTESUR Small (1+) (A) 03/04/2020 0823       Assessment & Plan:   Dysuria - U/A performed in office today. Will send urine for culture and treat with antibiotics at that time if warranted. Increase water intake. May take AZO for symptomatic relief. Recheck sooner if  fever, severe back pain, vomiting, or other acutely worsening symptoms.

## 2020-03-06 ENCOUNTER — Other Ambulatory Visit: Payer: Self-pay | Admitting: Physician Assistant

## 2020-03-06 LAB — URINE CULTURE
MICRO NUMBER:: 11486614
SPECIMEN QUALITY:: ADEQUATE

## 2020-03-06 MED ORDER — NITROFURANTOIN MONOHYD MACRO 100 MG PO CAPS
100.0000 mg | ORAL_CAPSULE | Freq: Two times a day (BID) | ORAL | 0 refills | Status: AC
Start: 2020-03-06 — End: 2020-03-13

## 2020-07-01 DIAGNOSIS — H04123 Dry eye syndrome of bilateral lacrimal glands: Secondary | ICD-10-CM | POA: Diagnosis not present

## 2020-07-01 DIAGNOSIS — H16143 Punctate keratitis, bilateral: Secondary | ICD-10-CM | POA: Diagnosis not present

## 2020-07-01 DIAGNOSIS — H40013 Open angle with borderline findings, low risk, bilateral: Secondary | ICD-10-CM | POA: Diagnosis not present

## 2020-07-01 DIAGNOSIS — H2513 Age-related nuclear cataract, bilateral: Secondary | ICD-10-CM | POA: Diagnosis not present

## 2020-07-01 DIAGNOSIS — H16213 Exposure keratoconjunctivitis, bilateral: Secondary | ICD-10-CM | POA: Diagnosis not present

## 2020-10-13 DIAGNOSIS — Z96651 Presence of right artificial knee joint: Secondary | ICD-10-CM | POA: Diagnosis not present

## 2020-10-27 ENCOUNTER — Encounter: Payer: Self-pay | Admitting: Physician Assistant

## 2020-10-27 ENCOUNTER — Other Ambulatory Visit: Payer: Self-pay

## 2020-10-27 ENCOUNTER — Ambulatory Visit (INDEPENDENT_AMBULATORY_CARE_PROVIDER_SITE_OTHER): Payer: Medicare Other | Admitting: Physician Assistant

## 2020-10-27 VITALS — BP 130/78 | HR 61 | Temp 97.2°F | Ht 68.0 in | Wt 165.2 lb

## 2020-10-27 DIAGNOSIS — Z1231 Encounter for screening mammogram for malignant neoplasm of breast: Secondary | ICD-10-CM

## 2020-10-27 DIAGNOSIS — Z131 Encounter for screening for diabetes mellitus: Secondary | ICD-10-CM | POA: Diagnosis not present

## 2020-10-27 DIAGNOSIS — Z13220 Encounter for screening for lipoid disorders: Secondary | ICD-10-CM

## 2020-10-27 DIAGNOSIS — Z780 Asymptomatic menopausal state: Secondary | ICD-10-CM | POA: Diagnosis not present

## 2020-10-27 DIAGNOSIS — M8580 Other specified disorders of bone density and structure, unspecified site: Secondary | ICD-10-CM | POA: Diagnosis not present

## 2020-10-27 DIAGNOSIS — Z13820 Encounter for screening for osteoporosis: Secondary | ICD-10-CM

## 2020-10-27 LAB — COMPREHENSIVE METABOLIC PANEL
ALT: 12 U/L (ref 0–35)
AST: 16 U/L (ref 0–37)
Albumin: 4.6 g/dL (ref 3.5–5.2)
Alkaline Phosphatase: 50 U/L (ref 39–117)
BUN: 22 mg/dL (ref 6–23)
CO2: 28 mEq/L (ref 19–32)
Calcium: 9.4 mg/dL (ref 8.4–10.5)
Chloride: 102 mEq/L (ref 96–112)
Creatinine, Ser: 0.8 mg/dL (ref 0.40–1.20)
GFR: 75.07 mL/min (ref >60.00)
Glucose, Bld: 87 mg/dL (ref 70–99)
Potassium: 4.2 mEq/L (ref 3.5–5.1)
Sodium: 138 mEq/L (ref 135–145)
Total Bilirubin: 0.6 mg/dL (ref 0.2–1.2)
Total Protein: 6.9 g/dL (ref 6.0–8.3)

## 2020-10-27 LAB — CBC WITH DIFFERENTIAL/PLATELET
Basophils Absolute: 0 10*3/uL (ref 0.0–0.1)
Basophils Relative: 0.9 % (ref 0.0–3.0)
Eosinophils Absolute: 0.1 10*3/uL (ref 0.0–0.7)
Eosinophils Relative: 2.6 % (ref 0.0–5.0)
HCT: 40.4 % (ref 36.0–46.0)
Hemoglobin: 13.5 g/dL (ref 12.0–15.0)
Lymphocytes Relative: 28.3 % (ref 12.0–46.0)
Lymphs Abs: 1.2 10*3/uL (ref 0.7–4.0)
MCHC: 33.3 g/dL (ref 30.0–36.0)
MCV: 101.8 fl — ABNORMAL HIGH (ref 78.0–100.0)
Monocytes Absolute: 0.3 10*3/uL (ref 0.1–1.0)
Monocytes Relative: 6.9 % (ref 3.0–12.0)
Neutro Abs: 2.6 10*3/uL (ref 1.4–7.7)
Neutrophils Relative %: 61.3 % (ref 43.0–77.0)
Platelets: 274 10*3/uL (ref 150.0–400.0)
RBC: 3.97 Mil/uL (ref 3.87–5.11)
RDW: 13.3 % (ref 11.5–15.5)
WBC: 4.3 10*3/uL (ref 4.0–10.5)

## 2020-10-27 LAB — LIPID PANEL
Cholesterol: 192 mg/dL (ref 0–200)
HDL: 81.7 mg/dL (ref >39.00)
LDL Cholesterol: 83 mg/dL (ref 0–99)
NonHDL: 109.83
Total CHOL/HDL Ratio: 2
Triglycerides: 132 mg/dL (ref 0.0–149.0)
VLDL: 26.4 mg/dL (ref 0.0–40.0)

## 2020-10-27 NOTE — Patient Instructions (Signed)
Good to meet you today! Please go to the lab for blood work and I will send results through [facility].

## 2020-10-27 NOTE — Progress Notes (Signed)
Subjective:    Patient ID: Brittney Stark, female    DOB: 04-08-1950, 70 y.o.   MRN: [id number]  Chief Complaint  Patient presents with   Annual Exam   Back Pain    Lower back x 1 month     HPI Patient is in today for recheck and TOC from Dr. Rogers Blocker.  Acute concerns: Low back pain in the last few weeks. NKI. She does enjoy walking and has had to taper off this. She took Advil, which has helped.   Health maintenance: Lifestyle/ exercise: Usually enjoys walking Nutrition: Doing well Mental health: No concerns  Sleep: Doing well Substance use: None ETOH: Wine 2-3x per week Immunizations: UTD Colonoscopy: UTD, next due 2024 Mammogram: Scheduled 11/20/20 DEXA: Need to update    Past Medical History:  Diagnosis Date   Allergy    Arthritis    Seasonal allergies     Past Surgical History:  Procedure Laterality Date   COLONOSCOPY     TOTAL VAGINAL HYSTERECTOMY     WISDOM TOOTH EXTRACTION      Family History  Problem Relation Age of Onset   Stroke Mother    Heart disease Mother    Diabetes Mother    Colon polyps Brother    Colon cancer Neg Hx    Esophageal cancer Neg Hx    Rectal cancer Neg Hx    Stomach cancer Neg Hx     Social History   Tobacco Use   Smoking status: Never   Smokeless tobacco: Never  Substance Use Topics   Alcohol use: Yes    Alcohol/week: 3.0 standard drinks    Types: 3 Glasses of wine per week   Drug use: No     No Known Allergies  Review of Systems  Constitutional:  Negative for activity change, appetite change, fever and unexpected weight change.  HENT:  Negative for congestion.   Eyes:  Negative for visual disturbance.  Respiratory:  Negative for apnea, cough and shortness of breath.   Cardiovascular:  Negative for chest pain, palpitations and leg swelling.  Gastrointestinal:  Negative for abdominal pain, blood in stool, constipation and diarrhea.  Endocrine: Negative for polydipsia, polyphagia and polyuria.  Genitourinary:   Negative for dysuria and pelvic pain.  Musculoskeletal:  Positive for back pain. Negative for arthralgias.  Skin:  Negative for rash.  Neurological:  Negative for dizziness, weakness and headaches.  Hematological:  Negative for adenopathy. Does not bruise/bleed easily.  Psychiatric/Behavioral:  Negative for sleep disturbance and suicidal ideas. The patient is not nervous/anxious.       Objective:     BP 130/78   Pulse 61   Temp (!) 97.2 F (36.2 C)   Ht 5\' 8"  (1.727 m)   Wt 165 lb 3.2 oz (74.9 kg)   SpO2 97%   BMI 25.12 kg/m   Wt Readings from Last 3 Encounters:  10/27/20 165 lb 3.2 oz (74.9 kg)  03/04/20 159 lb (72.1 kg)  02/07/20 157 lb 12.8 oz (71.6 kg)    BP Readings from Last 3 Encounters:  10/27/20 130/78  03/04/20 104/70  02/07/20 118/76     Physical Exam Vitals and nursing note reviewed.  Constitutional:      Appearance: Normal appearance. She is normal weight. She is not toxic-appearing.  HENT:     Head: Normocephalic and atraumatic.     Right Ear: Tympanic membrane, ear canal and external ear normal.     Left Ear: Tympanic membrane, ear canal and external  ear normal.     Nose: Nose normal.     Mouth/Throat:     Mouth: Mucous membranes are moist.  Eyes:     Extraocular Movements: Extraocular movements intact.     Conjunctiva/sclera: Conjunctivae normal.     Pupils: Pupils are equal, round, and reactive to light.  Cardiovascular:     Rate and Rhythm: Normal rate and regular rhythm.     Pulses: Normal pulses.     Heart sounds: Normal heart sounds.  Pulmonary:     Effort: Pulmonary effort is normal.     Breath sounds: Normal breath sounds.  Abdominal:     General: Abdomen is flat. Bowel sounds are normal.     Palpations: Abdomen is soft.  Musculoskeletal:        General: Normal range of motion.     Cervical back: Normal range of motion and neck supple.  Skin:    General: Skin is warm and dry.  Neurological:     General: No focal deficit present.      Mental Status: She is alert and oriented to person, place, and time.  Psychiatric:        Mood and Affect: Mood normal.        Behavior: Behavior normal.        Thought Content: Thought content normal.        Judgment: Judgment normal.       Assessment & Plan:   Problem List Items Addressed This Visit   None Visit Diagnoses     Diabetes mellitus screening    -  Primary   Relevant Orders   CBC with Differential/Platelet (Completed)   Comprehensive metabolic panel (Completed)   Screening for cholesterol level       Relevant Orders   CBC with Differential/Platelet (Completed)   Comprehensive metabolic panel (Completed)   Lipid panel (Completed)   Osteopenia, unspecified location       Relevant Orders   CBC with Differential/Platelet (Completed)   Comprehensive metabolic panel (Completed)   DG Bone Density   Encounter for screening mammogram for malignant neoplasm of breast       Relevant Orders   MM 3D SCREEN BREAST BILATERAL   Postmenopausal       Relevant Orders   DG Bone Density      Age-appropriate screening and counseling performed today. Will check labs and call with results. Preventive measures discussed and printed in AVS for patient.  She is doing well taking care of herself.  Low back pain seems to be improving, will investigate further if she has persistent issues.  No red flags on exam.  Bone density and mammogram orders placed.   This note was prepared with assistance of Systems analyst. Occasional wrong-word or sound-a-like substitutions may have occurred due to the inherent limitations of voice recognition software.  Time Spent: 32 minutes of total time was spent on the date of the encounter performing the following actions: chart review prior to seeing the patient, obtaining history, performing a medically necessary exam, counseling on the treatment plan, placing orders, and documenting in our EHR.    Shaddai Shapley M Shahab Polhamus, PA-C

## 2020-11-20 ENCOUNTER — Other Ambulatory Visit: Payer: Self-pay

## 2020-11-20 ENCOUNTER — Ambulatory Visit
Admission: RE | Admit: 2020-11-20 | Discharge: 2020-11-20 | Disposition: A | Discharge status: Home / Self Care | Payer: Medicare Other | Source: Ambulatory Visit | Attending: Physician Assistant | Admitting: Physician Assistant

## 2020-11-20 DIAGNOSIS — Z1231 Encounter for screening mammogram for malignant neoplasm of breast: Secondary | ICD-10-CM

## 2020-12-14 DIAGNOSIS — M25552 Pain in left hip: Secondary | ICD-10-CM | POA: Diagnosis not present

## 2020-12-23 DIAGNOSIS — M25552 Pain in left hip: Secondary | ICD-10-CM | POA: Diagnosis not present

## 2021-01-11 DIAGNOSIS — B078 Other viral warts: Secondary | ICD-10-CM | POA: Diagnosis not present

## 2021-01-11 DIAGNOSIS — R238 Other skin changes: Secondary | ICD-10-CM | POA: Diagnosis not present

## 2021-01-18 DIAGNOSIS — M25552 Pain in left hip: Secondary | ICD-10-CM | POA: Diagnosis not present

## 2021-01-22 DIAGNOSIS — M25552 Pain in left hip: Secondary | ICD-10-CM | POA: Diagnosis not present

## 2021-01-22 DIAGNOSIS — M1612 Unilateral primary osteoarthritis, left hip: Secondary | ICD-10-CM | POA: Diagnosis not present

## 2021-01-29 ENCOUNTER — Telehealth: Payer: Self-pay

## 2021-01-29 NOTE — Telephone Encounter (Signed)
Received Surgical clearance forms from [facility] for the patient. Placed in [place] folder.

## 2021-02-03 NOTE — Telephone Encounter (Signed)
Form received

## 2021-02-05 NOTE — Telephone Encounter (Signed)
Form placed in [place] for forms located on the wall behind desk

## 2021-02-08 DIAGNOSIS — R238 Other skin changes: Secondary | ICD-10-CM | POA: Diagnosis not present

## 2021-02-08 DIAGNOSIS — B078 Other viral warts: Secondary | ICD-10-CM | POA: Diagnosis not present

## 2021-02-12 ENCOUNTER — Ambulatory Visit: Payer: Medicare Other

## 2021-02-15 ENCOUNTER — Ambulatory Visit (INDEPENDENT_AMBULATORY_CARE_PROVIDER_SITE_OTHER): Payer: Medicare Other

## 2021-02-15 DIAGNOSIS — Z0000 Encounter for general adult medical examination without abnormal findings: Secondary | ICD-10-CM | POA: Diagnosis not present

## 2021-02-15 NOTE — Progress Notes (Signed)
Virtual Visit via Telephone Note  I connected with  Brittney Stark on 02/15/21 at 10:15 AM EST by telephone and verified that I am speaking with the correct person using two identifiers.  Medicare Annual Wellness visit completed telephonically due to Covid-19 pandemic.   Persons participating in this call: This Health Coach and this patient.   Location: Patient: Home Provider: Office   I discussed the limitations, risks, security and privacy concerns of performing an evaluation and management service by telephone and the availability of in person appointments. The patient expressed understanding and agreed to proceed.  Unable to perform video visit due to video visit attempted and failed and/or patient does not have video capability.   Some vital signs may be absent or patient reported.   Willette Brace, LPN   Subjective:   Brittney Stark is a 71 y.o. female who presents for Medicare Annual (Subsequent) preventive examination.  Review of Systems     Cardiac Risk Factors include: advanced age (>71men, >54 women)     Objective:    Today's Vitals   02/15/21 1009  PainSc: 10-Worst pain ever   There is no height or weight on file to calculate BMI.  Advanced Directives 02/15/2021 02/07/2020  Does Patient Have a Medical Advance Directive? Yes Yes  Type of Paramedic of [place];Living will  Copy of [place] in Chart? No - copy requested No - copy requested    Current Medications (verified) No outpatient encounter medications on file as of 02/15/2021.   No facility-administered encounter medications on file as of 02/15/2021.    Allergies (verified) Patient has no known allergies.   History: Past Medical History:  Diagnosis Date   Allergy    Arthritis    Seasonal allergies    Past Surgical History:  Procedure Laterality Date   COLONOSCOPY     TOTAL VAGINAL HYSTERECTOMY     WISDOM TOOTH EXTRACTION      Family History  Problem Relation Age of Onset   Stroke Mother    Heart disease Mother    Diabetes Mother    Colon polyps Brother    Colon cancer Neg Hx    Esophageal cancer Neg Hx    Rectal cancer Neg Hx    Stomach cancer Neg Hx    Social History   Socioeconomic History   Marital status: Unknown    Spouse name: Not on file   Number of children: Not on file   Years of education: Not on file   Highest education level: Not on file  Occupational History   Occupation: semi retired   Tobacco Use   Smoking status: Never   Smokeless tobacco: Never  Substance and Sexual Activity   Alcohol use: Yes    Alcohol/week: 3.0 standard drinks    Types: 3 Glasses of wine per week   Drug use: No   Sexual activity: Never  Other Topics Concern   Not on file  Social History Narrative   Lost > 65 pounds using [employer].    Social Determinants of Health   Financial Resource Strain: Low Risk    Difficulty of Paying Living Expenses: Not hard at all  Food Insecurity: No Food Insecurity   Worried About Charity fundraiser in the Last Year: Never true   [place] in the Last Year: Never true  Transportation Needs: No Transportation Needs   Lack of Transportation (Medical): No   Lack  of Transportation (Non-Medical): No  Physical Activity: Sufficiently Active   Days of Exercise per Week: 7 days   Minutes of Exercise per Session: 30 min  Stress: No Stress Concern Present   Feeling of Stress : Not at all  Social Connections: Socially Integrated   Frequency of Communication with Friends and Family: Once a week   Frequency of Social Gatherings with Friends and Family: More than three times a week   Attends Religious Services: 1 to 4 times per year   Active Member of [employer] or Organizations: Yes   Attends Archivist Meetings: 1 to 4 times per year   Marital Status: Living with partner    Tobacco Counseling Counseling given: Not Answered   Clinical Intake:  Pre-visit  preparation completed: Yes  Pain : 0-10 Pain Score: 10-Worst pain ever Pain Type: Chronic pain Pain Location: Hip (left hip and leg surgery set April 06, 2021) Pain Orientation: Left Pain Descriptors / Indicators: Aching, Throbbing Pain Onset: More than a month ago Pain Frequency: Constant     BMI - recorded: 25.12 Nutritional Status: BMI 25 -29 Overweight Nutritional Risks: None Diabetes: No  How often do you need to have someone help you when you read instructions, pamphlets, or other written materials from your doctor or pharmacy?: 1 - Never  Diabetic?no  Interpreter Needed?: No  Information entered by :: [name], LPN   Activities of Daily Living In your present state of health, do you have any difficulty performing the following activities: 02/15/2021  Hearing? N  Vision? N  Difficulty concentrating or making decisions? N  Walking or climbing stairs? Y  Comment with hip at times  Dressing or bathing? N  Doing errands, shopping? N  Preparing Food and eating ? N  Using the Toilet? N  In the past six months, have you accidently leaked urine? N  Do you have problems with loss of bowel control? N  Managing your Medications? N  Managing your Finances? N  Housekeeping or managing your Housekeeping? N  Some recent data might be hidden    Patient Care Team: Allwardt, Randa Evens, PA-C as PCP - General (Physician Assistant) Tiajuana Amass, MD as Referring Physician (Allergy and Immunology)  Indicate any recent Medical Services you may have received from other than [facility] providers in the past year (date may be approximate).     Assessment:   This is a routine wellness examination for Brittney Stark.  Hearing/Vision screen Hearing Screening - Comments:: Denies any hearing issues  Vision Screening - Comments:: Pt follows up with Dr Katy Fitch for annul eye exams   Dietary issues and exercise activities discussed: Current Exercise Habits: Home exercise routine, Type of exercise: Other  - see comments, Time (Minutes): 30, Frequency (Times/Week): 7, Weekly Exercise (Minutes/Week): 210   Goals Addressed             This Visit's Progress    Patient Stated       Lose weight        Depression Screen PHQ 2/9 Scores 02/15/2021 10/27/2020 02/07/2020 09/11/2019 01/04/2019 01/04/2019 12/30/2016  PHQ - 2 Score 0 0 0 0 0 0 0    Fall Risk Fall Risk  02/15/2021 10/27/2020 02/07/2020 09/11/2019 01/04/2019  Falls in the past year? 0 0 0 0 0  Comment - - - - -  Number falls in past yr: 0 0 0 - 0  Comment - - - - -  Injury with Fall? 0 0 0 - 0  Risk for  fall due to : Impaired mobility No Fall Risks Impaired vision No Fall Risks -  Risk for fall due to: Comment rebalance for hip issues - - - -  Follow up Falls prevention discussed Falls evaluation completed Falls prevention discussed - -    FALL RISK PREVENTION PERTAINING TO THE HOME:  Any stairs in or around the home? Yes  If so, are there any without handrails? No  Home free of loose throw rugs in walkways, pet beds, electrical cords, etc? Yes  Adequate lighting in your home to reduce risk of falls? Yes   ASSISTIVE DEVICES UTILIZED TO PREVENT FALLS:  Life alert? No  Use of a cane, walker or w/c? No  Grab bars in the bathroom? No  Shower chair or bench in shower? Yes  Elevated toilet seat or a handicapped toilet? No   TIMED UP AND GO:  Was the test performed? No .   Cognitive Function:     6CIT Screen 02/15/2021 02/07/2020  What Year? 0 points 0 points  What month? 0 points 0 points  What time? 0 points -  Count back from 20 0 points 0 points  Months in reverse 0 points 0 points  Repeat phrase 0 points 0 points  Total Score 0 -    Immunizations Immunization History  Administered Date(s) Administered   PFIZER(Purple Top)SARS-COV-2 Vaccination 04/04/2019, 05/01/2019   Tdap 08/06/2017    TDAP status: Up to date  Flu Vaccine status: Declined, Education has been provided regarding the importance of this vaccine but  patient still declined. Advised may receive this vaccine at local pharmacy or Health Dept. Aware to provide a copy of the vaccination record if obtained from local pharmacy or Health Dept. Verbalized acceptance and understanding.  Pneumococcal vaccine status: Declined,  Education has been provided regarding the importance of this vaccine but patient still declined. Advised may receive this vaccine at local pharmacy or Health Dept. Aware to provide a copy of the vaccination record if obtained from local pharmacy or Health Dept. Verbalized acceptance and understanding.   Covid-19 vaccine status: Completed vaccines  Qualifies for Shingles Vaccine? Yes   Zostavax completed No   Shingrix Completed?: No.    Education has been provided regarding the importance of this vaccine. Patient has been advised to call insurance company to determine out of pocket expense if they have not yet received this vaccine. Advised may also receive vaccine at local pharmacy or Health Dept. Verbalized acceptance and understanding.  Screening Tests Health Maintenance  Topic Date Due   COLONOSCOPY (Pts 45-72yrs Insurance coverage will need to be confirmed)  02/06/2022   MAMMOGRAM  11/21/2022   TETANUS/TDAP  08/07/2027   DEXA SCAN  Completed   Hepatitis C Screening  Completed   HPV VACCINES  Aged Out   Pneumonia Vaccine 34+ Years old  Discontinued   INFLUENZA VACCINE  Discontinued   COVID-19 Vaccine  Discontinued   Zoster Vaccines- Shingrix  Discontinued    Health Maintenance  There are no preventive care reminders to display for this patient.   Colorectal cancer screening: Type of screening: Colonoscopy. Completed 02/06/17. Repeat every 5 years  Mammogram status: Completed 11/20/20. Repeat every year  Bone Density status: Completed 02/15/17. Results reflect: Bone density results: OSTEOPENIA. Repeat every 2 years.   Additional Screening:  Hepatitis C Screening:  Completed 01/04/19  Vision Screening:  Recommended annual ophthalmology exams for early detection of glaucoma and other disorders of the eye. Is the patient up to date with their annual  eye exam?  Yes  Who is the provider or what is the name of the office in which the patient attends annual eye exams? Dr Katy Fitch  If pt is not established with a provider, would they like to be referred to a provider to establish care? No .   Dental Screening: Recommended annual dental exams for proper oral hygiene  Community Resource Referral / Chronic Care Management: CRR required this visit?  No   CCM required this visit?  No      Plan:     I have personally reviewed and noted the following in the patients chart:   Medical and social history Use of alcohol, tobacco or illicit drugs  Current medications and supplements including opioid prescriptions.  Functional ability and status Nutritional status Physical activity Advanced directives List of other physicians Hospitalizations, surgeries, and ER visits in previous 12 months Vitals Screenings to include cognitive, depression, and falls Referrals and appointments  In addition, I have reviewed and discussed with patient certain preventive protocols, quality metrics, and best practice recommendations. A written personalized care plan for preventive services as well as general preventive health recommendations were provided to patient.     Willette Brace, LPN   1/79/1505   Nurse Notes: None

## 2021-02-15 NOTE — Patient Instructions (Signed)
Brittney Stark , Thank you for taking time to come for your Medicare Wellness Visit. I appreciate your ongoing commitment to your health goals. Please review the following plan we discussed and let me know if I can assist you in the future.   Screening recommendations/referrals: Colonoscopy: Done 02/06/17 repeat every 5 years  Mammogram: Done 11/20/20 repeat every year  Bone Density: Done 02/15/17 repeat every 2 years  Recommended yearly ophthalmology/optometry visit for glaucoma screening and checkup Recommended yearly dental visit for hygiene and checkup  Vaccinations: Influenza vaccine: discontinued  Pneumococcal vaccine: declined  Tdap vaccine: Done 08/06/17 repeat every 10 years  Shingles vaccine: Shingrix discussed & declined  Covid-19:Completed 3/4 & 05/01/19  Advanced directives: Please bring a copy of your health care power of attorney and living will to the office at your convenience.  Conditions/risks identified: Lose weight   Next appointment: Follow up in one year for your annual wellness visit    Preventive Care 65 Years and Older, Female Preventive care refers to lifestyle choices and visits with your health care provider that can promote health and wellness. What does preventive care include? A yearly physical exam. This is also called an annual well check. Dental exams once or twice a year. Routine eye exams. Ask your health care provider how often you should have your eyes checked. Personal lifestyle choices, including: Daily care of your teeth and gums. Regular physical activity. Eating a healthy diet. Avoiding tobacco and drug use. Limiting alcohol use. Practicing safe sex. Taking low-dose aspirin every day. Taking vitamin and mineral supplements as recommended by your health care provider. What happens during an annual well check? The services and screenings done by your health care provider during your annual well check will depend on your age, overall health,  lifestyle risk factors, and family history of disease. Counseling  Your health care provider may ask you questions about your: Alcohol use. Tobacco use. Drug use. Emotional well-being. Home and relationship well-being. Sexual activity. Eating habits. History of falls. Memory and ability to understand (cognition). Work and work Statistician. Reproductive health. Screening  You may have the following tests or measurements: Height, weight, and BMI. Blood pressure. Lipid and cholesterol levels. These may be checked every 5 years, or more frequently if you are over 40 years old. Skin check. Lung cancer screening. You may have this screening every year starting at age 53 if you have a 30-pack-year history of smoking and currently smoke or have quit within the past 15 years. Fecal occult blood test (FOBT) of the stool. You may have this test every year starting at age 32. Flexible sigmoidoscopy or colonoscopy. You may have a sigmoidoscopy every 5 years or a colonoscopy every 10 years starting at age 8. Hepatitis C blood test. Hepatitis B blood test. Sexually transmitted disease (STD) testing. Diabetes screening. This is done by checking your blood sugar (glucose) after you have not eaten for a while (fasting). You may have this done every 1-3 years. Bone density scan. This is done to screen for osteoporosis. You may have this done starting at age 28. Mammogram. This may be done every 1-2 years. Talk to your health care provider about how often you should have regular mammograms. Talk with your health care provider about your test results, treatment options, and if necessary, the need for more tests. Vaccines  Your health care provider may recommend certain vaccines, such as: Influenza vaccine. This is recommended every year. Tetanus, diphtheria, and acellular pertussis (Tdap, Td) vaccine. You may  need a Td booster every 10 years. Zoster vaccine. You may need this after age  31. Pneumococcal 13-valent conjugate (PCV13) vaccine. One dose is recommended after age 57. Pneumococcal polysaccharide (PPSV23) vaccine. One dose is recommended after age 56. Talk to your health care provider about which screenings and vaccines you need and how often you need them. This information is not intended to replace advice given to you by your health care provider. Make sure you discuss any questions you have with your health care provider. Document Released: 02/13/2015 Document Revised: 10/07/2015 Document Reviewed: 11/18/2014 Elsevier Interactive Patient Education  2017 [employer] Prevention in the Home Falls can cause injuries. They can happen to people of all ages. There are many things you can do to make your home safe and to help prevent falls. What can I do on the outside of my home? Regularly fix the edges of walkways and driveways and fix any cracks. Remove anything that might make you trip as you walk through a door, such as a raised step or threshold. Trim any bushes or trees on the path to your home. Use bright outdoor lighting. Clear any walking paths of anything that might make someone trip, such as rocks or tools. Regularly check to see if handrails are loose or broken. Make sure that both sides of any steps have handrails. Any raised decks and porches should have guardrails on the edges. Have any leaves, snow, or ice cleared regularly. Use sand or salt on walking paths during winter. Clean up any spills in your garage right away. This includes oil or grease spills. What can I do in the bathroom? Use night lights. Install grab bars by the toilet and in the tub and shower. Do not use towel bars as grab bars. Use non-skid mats or decals in the tub or shower. If you need to sit down in the shower, use a plastic, non-slip stool. Keep the floor dry. Clean up any water that spills on the floor as soon as it happens. Remove soap buildup in the tub or shower  regularly. Attach bath mats securely with double-sided non-slip rug tape. Do not have throw rugs and other things on the floor that can make you trip. What can I do in the bedroom? Use night lights. Make sure that you have a light by your bed that is easy to reach. Do not use any sheets or blankets that are too big for your bed. They should not hang down onto the floor. Have a firm chair that has side arms. You can use this for support while you get dressed. Do not have throw rugs and other things on the floor that can make you trip. What can I do in the kitchen? Clean up any spills right away. Avoid walking on wet floors. Keep items that you use a lot in easy-to-reach places. If you need to reach something above you, use a strong step stool that has a grab bar. Keep electrical cords out of the way. Do not use floor polish or wax that makes floors slippery. If you must use wax, use non-skid floor wax. Do not have throw rugs and other things on the floor that can make you trip. What can I do with my stairs? Do not leave any items on the stairs. Make sure that there are handrails on both sides of the stairs and use them. Fix handrails that are broken or loose. Make sure that handrails are as long as the stairways.  Check any carpeting to make sure that it is firmly attached to the stairs. Fix any carpet that is loose or worn. Avoid having throw rugs at the top or bottom of the stairs. If you do have throw rugs, attach them to the floor with carpet tape. Make sure that you have a light switch at the top of the stairs and the bottom of the stairs. If you do not have them, ask someone to add them for you. What else can I do to help prevent falls? Wear shoes that: Do not have high heels. Have rubber bottoms. Are comfortable and fit you well. Are closed at the toe. Do not wear sandals. If you use a stepladder: Make sure that it is fully opened. Do not climb a closed stepladder. Make sure that  both sides of the stepladder are locked into place. Ask someone to hold it for you, if possible. Clearly mark and make sure that you can see: Any grab bars or handrails. First and last steps. Where the edge of each step is. Use tools that help you move around (mobility aids) if they are needed. These include: Canes. Walkers. Scooters. Crutches. Turn on the lights when you go into a dark area. Replace any light bulbs as soon as they burn out. Set up your furniture so you have a clear path. Avoid moving your furniture around. If any of your floors are uneven, fix them. If there are any pets around you, be aware of where they are. Review your medicines with your doctor. Some medicines can make you feel dizzy. This can increase your chance of falling. Ask your doctor what other things that you can do to help prevent falls. This information is not intended to replace advice given to you by your health care provider. Make sure you discuss any questions you have with your health care provider. Document Released: 11/13/2008 Document Revised: 06/25/2015 Document Reviewed: 02/21/2014 Elsevier Interactive Patient Education  2017 [employer].

## 2021-03-08 DIAGNOSIS — R238 Other skin changes: Secondary | ICD-10-CM | POA: Diagnosis not present

## 2021-03-08 DIAGNOSIS — B078 Other viral warts: Secondary | ICD-10-CM | POA: Diagnosis not present

## 2021-03-23 ENCOUNTER — Other Ambulatory Visit: Payer: Self-pay

## 2021-03-23 ENCOUNTER — Encounter: Payer: Self-pay | Admitting: Physician Assistant

## 2021-03-23 ENCOUNTER — Ambulatory Visit (INDEPENDENT_AMBULATORY_CARE_PROVIDER_SITE_OTHER): Payer: Medicare Other | Admitting: Physician Assistant

## 2021-03-23 VITALS — BP 114/72 | HR 66 | Temp 98.2°F | Ht 68.0 in | Wt 160.2 lb

## 2021-03-23 DIAGNOSIS — N00A Acute nephritic syndrome with C3 glomerulonephritis: Secondary | ICD-10-CM

## 2021-03-23 DIAGNOSIS — Z01818 Encounter for other preprocedural examination: Secondary | ICD-10-CM

## 2021-03-23 DIAGNOSIS — M1909 Primary osteoarthritis, other specified site: Secondary | ICD-10-CM | POA: Diagnosis not present

## 2021-03-23 LAB — CBC WITH DIFFERENTIAL/PLATELET
Basophils Absolute: 0 10*3/uL (ref 0.0–0.1)
Basophils Relative: 1 % (ref 0.0–3.0)
Eosinophils Absolute: 0 10*3/uL (ref 0.0–0.7)
Eosinophils Relative: 1.4 % (ref 0.0–5.0)
HCT: 39.9 % (ref 36.0–46.0)
Hemoglobin: 13.2 g/dL (ref 12.0–15.0)
Lymphocytes Relative: 36.4 % (ref 12.0–46.0)
Lymphs Abs: 1.2 10*3/uL (ref 0.7–4.0)
MCHC: 33.2 g/dL (ref 30.0–36.0)
MCV: 101.2 fl — ABNORMAL HIGH (ref 78.0–100.0)
Monocytes Absolute: 0.2 10*3/uL (ref 0.1–1.0)
Monocytes Relative: 7.3 % (ref 3.0–12.0)
Neutro Abs: 1.7 10*3/uL (ref 1.4–7.7)
Neutrophils Relative %: 53.9 % (ref 43.0–77.0)
Platelets: 275 10*3/uL (ref 150.0–400.0)
RBC: 3.94 Mil/uL (ref 3.87–5.11)
RDW: 13.8 % (ref 11.5–15.5)
WBC: 3.2 10*3/uL — ABNORMAL LOW (ref 4.0–10.5)

## 2021-03-23 LAB — COMPREHENSIVE METABOLIC PANEL
ALT: 13 U/L (ref 0–35)
AST: 18 U/L (ref 0–37)
Albumin: 5 g/dL (ref 3.5–5.2)
Alkaline Phosphatase: 42 U/L (ref 39–117)
BUN: 17 mg/dL (ref 6–23)
CO2: 29 mEq/L (ref 19–32)
Calcium: 10 mg/dL (ref 8.4–10.5)
Chloride: 102 mEq/L (ref 96–112)
Creatinine, Ser: 0.79 mg/dL (ref 0.40–1.20)
GFR: 75.99 mL/min (ref >60.00)
Glucose, Bld: 85 mg/dL (ref 70–99)
Potassium: 5 mEq/L (ref 3.5–5.1)
Sodium: 139 mEq/L (ref 135–145)
Total Bilirubin: 0.7 mg/dL (ref 0.2–1.2)
Total Protein: 6.9 g/dL (ref 6.0–8.3)

## 2021-03-23 LAB — PROTIME-INR
INR: 0.9 ratio (ref 0.8–1.0)
Prothrombin Time: 10 s (ref 9.6–13.1)

## 2021-03-23 NOTE — Progress Notes (Signed)
Subjective:    Patient ID: Brittney Stark, female    DOB: 1950/02/22, 71 y.o.   MRN: [id number]  Chief Complaint  Patient presents with   Pre-op Exam    HPI Patient is in today for pre-op examination. No major medical changes or hospitalizations since last visit.   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 Surgery is scheduled for April 06, 2021.   -No history of cardiac issues.  -Takes no medications other than occasional Tylenol, Tumeric, or B12.  -Has been intentionally losing weight through nutrition   -No issues with anesthesia, no family issues with anesthesia   -Able to go up and down two flights of stairs without any chest pain or SOB symptoms.   Past Medical History:  Diagnosis Date   Allergy    Arthritis    Seasonal allergies     Past Surgical History:  Procedure Laterality Date   COLONOSCOPY     TOTAL VAGINAL HYSTERECTOMY     WISDOM TOOTH EXTRACTION      Family History  Problem Relation Age of Onset   Stroke Mother    Heart disease Mother    Diabetes Mother    Colon polyps Brother    Colon cancer Neg Hx    Esophageal cancer Neg Hx    Rectal cancer Neg Hx    Stomach cancer Neg Hx     Social History   Tobacco Use   Smoking status: Never   Smokeless tobacco: Never  Substance Use Topics   Alcohol use: Yes    Alcohol/week: 3.0 standard drinks    Types: 3 Glasses of wine per week   Drug use: No     No Known Allergies  Review of Systems NEGATIVE UNLESS OTHERWISE INDICATED IN HPI      Objective:     BP 114/72    Pulse 66    Temp 98.2 F (36.8 C)    Ht 5\' 8"  (1.727 m)    Wt 160 lb 3.2 oz (72.7 kg)    SpO2 99%    BMI 24.36 kg/m   Wt Readings from Last 3 Encounters:  03/23/21 160 lb 3.2 oz (72.7 kg)  10/27/20 165 lb 3.2 oz (74.9 kg)  03/04/20 159 lb (72.1 kg)    BP Readings from Last 3 Encounters:  03/23/21 114/72  10/27/20 130/78  03/04/20 104/70     Physical Exam Vitals and nursing note reviewed.  Constitutional:       Appearance: Normal appearance. She is normal weight. She is not toxic-appearing.  HENT:     Head: Normocephalic and atraumatic.     Right Ear: External ear normal.     Left Ear: External ear normal.     Nose: Nose normal.     Mouth/Throat:     Mouth: Mucous membranes are moist.  Eyes:     Extraocular Movements: Extraocular movements intact.     Conjunctiva/sclera: Conjunctivae normal.     Pupils: Pupils are equal, round, and reactive to light.  Cardiovascular:     Rate and Rhythm: Normal rate and regular rhythm.     Pulses: Normal pulses.     Heart sounds: Normal heart sounds.  Pulmonary:     Effort: Pulmonary effort is normal.     Breath sounds: Normal breath sounds.  Musculoskeletal:     Cervical back: Normal range of motion and neck supple.  Skin:    General: Skin is warm and dry.  Neurological:  General: No focal deficit present.     Mental Status: She is alert and oriented to person, place, and time.     Gait: Gait abnormal (secondary to hip pain).  Psychiatric:        Mood and Affect: Mood normal.        Behavior: Behavior normal.        Thought Content: Thought content normal.        Judgment: Judgment normal.       Assessment & Plan:   Problem List Items Addressed This Visit   None Visit Diagnoses     Preop examination    -  Primary   Primary osteoarthritis, other specified site       Acute nephritic syndrome with C3 glomerulonephritis           1. Preop examination 2. Primary osteoarthritis, other specified site -RCRI is zero, cleared for surgery from medical and cardiac standpoint pending no major changes on lab work  -Previous EKG done 09/11/2019 normal, no indications to repeat EKG today, no cardiac history, no cardiac symptoms -Will check labs as requested from surgeon -Pt to f/up if any concerns     Jil Penland M Flonnie Wierman, PA-C

## 2021-03-24 ENCOUNTER — Telehealth: Payer: Self-pay | Admitting: Physician Assistant

## 2021-03-24 NOTE — Telephone Encounter (Signed)
Pt states she has hip replacement surgery scheduled for 04/06/21. She is concerned with the lab results she received. She is worried she may not be able to have her surgery and is wanting to get clarification from [place]. Please advise

## 2021-03-25 ENCOUNTER — Other Ambulatory Visit: Payer: Self-pay

## 2021-03-25 DIAGNOSIS — D729 Disorder of white blood cells, unspecified: Secondary | ICD-10-CM

## 2021-03-25 NOTE — Progress Notes (Signed)
CBC

## 2021-03-25 NOTE — Telephone Encounter (Signed)
Notified patient of concerns she voices understanding

## 2021-03-29 DIAGNOSIS — B078 Other viral warts: Secondary | ICD-10-CM | POA: Diagnosis not present

## 2021-03-30 ENCOUNTER — Other Ambulatory Visit (INDEPENDENT_AMBULATORY_CARE_PROVIDER_SITE_OTHER): Payer: Medicare Other

## 2021-03-30 ENCOUNTER — Other Ambulatory Visit: Payer: Self-pay

## 2021-03-30 DIAGNOSIS — D729 Disorder of white blood cells, unspecified: Secondary | ICD-10-CM | POA: Diagnosis not present

## 2021-03-30 LAB — CBC
HCT: 38.8 % (ref 36.0–46.0)
Hemoglobin: 13.2 g/dL (ref 12.0–15.0)
MCHC: 33.9 g/dL (ref 30.0–36.0)
MCV: 101.2 fl — ABNORMAL HIGH (ref 78.0–100.0)
Platelets: 279 10*3/uL (ref 150.0–400.0)
RBC: 3.83 Mil/uL — ABNORMAL LOW (ref 3.87–5.11)
RDW: 13.9 % (ref 11.5–15.5)
WBC: 3.2 10*3/uL — ABNORMAL LOW (ref 4.0–10.5)

## 2021-04-01 ENCOUNTER — Other Ambulatory Visit: Payer: Self-pay

## 2021-04-01 ENCOUNTER — Telehealth: Payer: Self-pay | Admitting: Physician Assistant

## 2021-04-01 DIAGNOSIS — D7589 Other specified diseases of blood and blood-forming organs: Secondary | ICD-10-CM

## 2021-04-01 DIAGNOSIS — D729 Disorder of white blood cells, unspecified: Secondary | ICD-10-CM

## 2021-04-01 NOTE — Telephone Encounter (Signed)
Pt asking for lab results- requesting a call back.  ?

## 2021-04-02 NOTE — Telephone Encounter (Signed)
Patient given lab results. 

## 2021-04-06 DIAGNOSIS — M1612 Unilateral primary osteoarthritis, left hip: Secondary | ICD-10-CM | POA: Diagnosis not present

## 2021-04-07 ENCOUNTER — Other Ambulatory Visit: Payer: Self-pay

## 2021-04-07 DIAGNOSIS — D7589 Other specified diseases of blood and blood-forming organs: Secondary | ICD-10-CM

## 2021-04-07 DIAGNOSIS — E559 Vitamin D deficiency, unspecified: Secondary | ICD-10-CM

## 2021-04-07 DIAGNOSIS — D72819 Decreased white blood cell count, unspecified: Secondary | ICD-10-CM

## 2021-04-15 DIAGNOSIS — S0512XA Contusion of eyeball and orbital tissues, left eye, initial encounter: Secondary | ICD-10-CM | POA: Diagnosis not present

## 2021-05-03 ENCOUNTER — Other Ambulatory Visit (INDEPENDENT_AMBULATORY_CARE_PROVIDER_SITE_OTHER): Payer: Medicare Other

## 2021-05-03 DIAGNOSIS — E559 Vitamin D deficiency, unspecified: Secondary | ICD-10-CM | POA: Diagnosis not present

## 2021-05-03 DIAGNOSIS — D72819 Decreased white blood cell count, unspecified: Secondary | ICD-10-CM

## 2021-05-03 DIAGNOSIS — D7589 Other specified diseases of blood and blood-forming organs: Secondary | ICD-10-CM

## 2021-05-03 LAB — VITAMIN D 25 HYDROXY (VIT D DEFICIENCY, FRACTURES): VITD: 15.56 ng/mL — ABNORMAL LOW (ref 30.00–100.00)

## 2021-05-03 LAB — B12 AND FOLATE PANEL
Folate: 11 ng/mL (ref >5.9)
Vitamin B-12: 322 pg/mL (ref 211–911)

## 2021-05-04 LAB — PATHOLOGIST SMEAR REVIEW

## 2021-05-11 ENCOUNTER — Telehealth: Payer: Self-pay | Admitting: Physician Assistant

## 2021-05-11 ENCOUNTER — Other Ambulatory Visit: Payer: Self-pay | Admitting: *Deleted

## 2021-05-11 DIAGNOSIS — D7589 Other specified diseases of blood and blood-forming organs: Secondary | ICD-10-CM

## 2021-05-11 DIAGNOSIS — E559 Vitamin D deficiency, unspecified: Secondary | ICD-10-CM

## 2021-05-11 MED ORDER — VITAMIN D (ERGOCALCIFEROL) 1.25 MG (50000 UNIT) PO CAPS: 50000.0000 [IU] | ORAL_CAPSULE | ORAL | 2 refills | Status: DC

## 2021-05-11 NOTE — Telephone Encounter (Signed)
See result notes. 

## 2021-05-11 NOTE — Telephone Encounter (Signed)
Pt is asking to speak with someone regarding her labs. Please give a call back ?

## 2021-05-13 DIAGNOSIS — Z5189 Encounter for other specified aftercare: Secondary | ICD-10-CM | POA: Diagnosis not present

## 2021-06-09 ENCOUNTER — Other Ambulatory Visit (INDEPENDENT_AMBULATORY_CARE_PROVIDER_SITE_OTHER): Payer: Medicare Other

## 2021-06-09 DIAGNOSIS — D7589 Other specified diseases of blood and blood-forming organs: Secondary | ICD-10-CM

## 2021-06-09 DIAGNOSIS — E559 Vitamin D deficiency, unspecified: Secondary | ICD-10-CM | POA: Diagnosis not present

## 2021-06-09 LAB — CBC WITH DIFFERENTIAL/PLATELET
Basophils Absolute: 0 10*3/uL (ref 0.0–0.1)
Basophils Relative: 1 % (ref 0.0–3.0)
Eosinophils Absolute: 0.1 10*3/uL (ref 0.0–0.7)
Eosinophils Relative: 3.1 % (ref 0.0–5.0)
HCT: 39.4 % (ref 36.0–46.0)
Hemoglobin: 13 g/dL (ref 12.0–15.0)
Lymphocytes Relative: 34.2 % (ref 12.0–46.0)
Lymphs Abs: 1.5 10*3/uL (ref 0.7–4.0)
MCHC: 33.1 g/dL (ref 30.0–36.0)
MCV: 104.8 fl — ABNORMAL HIGH (ref 78.0–100.0)
Monocytes Absolute: 0.3 10*3/uL (ref 0.1–1.0)
Monocytes Relative: 7.9 % (ref 3.0–12.0)
Neutro Abs: 2.3 10*3/uL (ref 1.4–7.7)
Neutrophils Relative %: 53.8 % (ref 43.0–77.0)
Platelets: 267 10*3/uL (ref 150.0–400.0)
RBC: 3.76 Mil/uL — ABNORMAL LOW (ref 3.87–5.11)
RDW: 14.4 % (ref 11.5–15.5)
WBC: 4.3 10*3/uL (ref 4.0–10.5)

## 2021-06-09 LAB — VITAMIN D 25 HYDROXY (VIT D DEFICIENCY, FRACTURES): VITD: 32.01 ng/mL (ref 30.00–100.00)

## 2021-06-14 ENCOUNTER — Telehealth: Payer: Self-pay | Admitting: Physician Assistant

## 2021-06-14 NOTE — Telephone Encounter (Signed)
See result note.  

## 2021-06-14 NOTE — Telephone Encounter (Signed)
Pt returning a call about results. ? ?Pt states she will be available all day. ?

## 2021-07-07 DIAGNOSIS — H40013 Open angle with borderline findings, low risk, bilateral: Secondary | ICD-10-CM | POA: Diagnosis not present

## 2021-07-07 DIAGNOSIS — H2513 Age-related nuclear cataract, bilateral: Secondary | ICD-10-CM | POA: Diagnosis not present

## 2021-07-07 DIAGNOSIS — H16143 Punctate keratitis, bilateral: Secondary | ICD-10-CM | POA: Diagnosis not present

## 2021-07-07 DIAGNOSIS — H16213 Exposure keratoconjunctivitis, bilateral: Secondary | ICD-10-CM | POA: Diagnosis not present

## 2021-07-07 DIAGNOSIS — H04123 Dry eye syndrome of bilateral lacrimal glands: Secondary | ICD-10-CM | POA: Diagnosis not present

## 2021-09-16 DIAGNOSIS — M25551 Pain in right hip: Secondary | ICD-10-CM | POA: Diagnosis not present

## 2021-09-16 DIAGNOSIS — M1611 Unilateral primary osteoarthritis, right hip: Secondary | ICD-10-CM | POA: Diagnosis not present

## 2021-09-16 DIAGNOSIS — Z96642 Presence of left artificial hip joint: Secondary | ICD-10-CM | POA: Diagnosis not present

## 2021-10-15 ENCOUNTER — Ambulatory Visit (INDEPENDENT_AMBULATORY_CARE_PROVIDER_SITE_OTHER): Payer: Medicare Other | Admitting: Physician Assistant

## 2021-10-15 ENCOUNTER — Encounter: Payer: Self-pay | Admitting: Physician Assistant

## 2021-10-15 VITALS — BP 108/71 | HR 68 | Temp 98.3°F | Ht 68.0 in | Wt 174.6 lb

## 2021-10-15 DIAGNOSIS — Z01818 Encounter for other preprocedural examination: Secondary | ICD-10-CM

## 2021-10-15 DIAGNOSIS — M1611 Unilateral primary osteoarthritis, right hip: Secondary | ICD-10-CM | POA: Diagnosis not present

## 2021-10-15 LAB — CBC WITH DIFFERENTIAL/PLATELET
Basophils Absolute: 0 10*3/uL (ref 0.0–0.1)
Basophils Relative: 0.3 % (ref 0.0–3.0)
Eosinophils Absolute: 0.1 10*3/uL (ref 0.0–0.7)
Eosinophils Relative: 1.5 % (ref 0.0–5.0)
HCT: 37.9 % (ref 36.0–46.0)
Hemoglobin: 12.7 g/dL (ref 12.0–15.0)
Lymphocytes Relative: 17.1 % (ref 12.0–46.0)
Lymphs Abs: 1.4 10*3/uL (ref 0.7–4.0)
MCHC: 33.5 g/dL (ref 30.0–36.0)
MCV: 103.4 fl — ABNORMAL HIGH (ref 78.0–100.0)
Monocytes Absolute: 0.5 10*3/uL (ref 0.1–1.0)
Monocytes Relative: 6.1 % (ref 3.0–12.0)
Neutro Abs: 6.1 10*3/uL (ref 1.4–7.7)
Neutrophils Relative %: 75 % (ref 43.0–77.0)
Platelets: 265 10*3/uL (ref 150.0–400.0)
RBC: 3.66 Mil/uL — ABNORMAL LOW (ref 3.87–5.11)
RDW: 13.9 % (ref 11.5–15.5)
WBC: 8.1 10*3/uL (ref 4.0–10.5)

## 2021-10-15 LAB — COMPREHENSIVE METABOLIC PANEL
ALT: 11 U/L (ref 0–35)
AST: 16 U/L (ref 0–37)
Albumin: 4.3 g/dL (ref 3.5–5.2)
Alkaline Phosphatase: 60 U/L (ref 39–117)
BUN: 26 mg/dL — ABNORMAL HIGH (ref 6–23)
CO2: 28 mEq/L (ref 19–32)
Calcium: 9.6 mg/dL (ref 8.4–10.5)
Chloride: 102 mEq/L (ref 96–112)
Creatinine, Ser: 1.22 mg/dL — ABNORMAL HIGH (ref 0.40–1.20)
GFR: 44.93 mL/min — ABNORMAL LOW (ref >60.00)
Glucose, Bld: 98 mg/dL (ref 70–99)
Potassium: 5.5 mEq/L — ABNORMAL HIGH (ref 3.5–5.1)
Sodium: 137 mEq/L (ref 135–145)
Total Bilirubin: 0.4 mg/dL (ref 0.2–1.2)
Total Protein: 7 g/dL (ref 6.0–8.3)

## 2021-10-15 NOTE — Progress Notes (Unsigned)
   Subjective:    Patient ID: Brittney Stark, female    DOB: May 11, 1950, 71 y.o.   MRN: [id number]  Chief Complaint  Patient presents with   clearance for hip    Pt gets rt hip replacement 11/02/21, and needs clearance. Need labs    HPI Patient is in today for pre-op clearance for replacement of RIGHT hip this time. Scheduled for 11/02/21 with Dr. Wynelle Link.  Had left hip done 04/13/21 No health changes since this procedure. Able to go upstairs without SOB or chest pain.  No anesthesia issues. No headaches, dizziness, fainting, falls.   Past Medical History:  Diagnosis Date   Allergy    Arthritis    Seasonal allergies     Past Surgical History:  Procedure Laterality Date   COLONOSCOPY     TOTAL VAGINAL HYSTERECTOMY     WISDOM TOOTH EXTRACTION      Family History  Problem Relation Age of Onset   Stroke Mother    Heart disease Mother    Diabetes Mother    Colon polyps Brother    Colon cancer Neg Hx    Esophageal cancer Neg Hx    Rectal cancer Neg Hx    Stomach cancer Neg Hx     Social History   Tobacco Use   Smoking status: Never   Smokeless tobacco: Never  Substance Use Topics   Alcohol use: Yes    Alcohol/week: 3.0 standard drinks of alcohol    Types: 3 Glasses of wine per week   Drug use: No     No Known Allergies  Review of Systems NEGATIVE UNLESS OTHERWISE INDICATED IN HPI      Objective:     BP 108/71 (BP Location: Left Arm, Patient Position: Sitting)   Pulse 68   Temp 98.3 F (36.8 C) (Temporal)   Ht '5\' 8"'$  (1.727 m)   Wt 174 lb 9.6 oz (79.2 kg)   SpO2 97%   BMI 26.55 kg/m   Wt Readings from Last 3 Encounters:  10/15/21 174 lb 9.6 oz (79.2 kg)  03/23/21 160 lb 3.2 oz (72.7 kg)  10/27/20 165 lb 3.2 oz (74.9 kg)    BP Readings from Last 3 Encounters:  10/15/21 108/71  03/23/21 114/72  10/27/20 130/78     Physical Exam     Assessment & Plan:  There are no diagnoses linked to this encounter.      No follow-ups on file.  This  note was prepared with assistance of Systems analyst. Occasional wrong-word or sound-a-like substitutions may have occurred due to the inherent limitations of voice recognition software.  Time Spent: *** minutes of total time was spent on the date of the encounter performing the following actions: chart review prior to seeing the patient, obtaining history, performing a medically necessary exam, counseling on the treatment plan, placing orders, and documenting in our EHR.       Nakenya Theall M Aylinn Rydberg, PA-C

## 2021-10-25 ENCOUNTER — Other Ambulatory Visit: Payer: Self-pay

## 2021-10-25 ENCOUNTER — Other Ambulatory Visit (INDEPENDENT_AMBULATORY_CARE_PROVIDER_SITE_OTHER): Payer: Medicare Other

## 2021-10-25 DIAGNOSIS — E875 Hyperkalemia: Secondary | ICD-10-CM

## 2021-10-25 LAB — COMPREHENSIVE METABOLIC PANEL
ALT: 11 U/L (ref 0–35)
AST: 14 U/L (ref 0–37)
Albumin: 4.3 g/dL (ref 3.5–5.2)
Alkaline Phosphatase: 49 U/L (ref 39–117)
BUN: 19 mg/dL (ref 6–23)
CO2: 26 mEq/L (ref 19–32)
Calcium: 9.2 mg/dL (ref 8.4–10.5)
Chloride: 103 mEq/L (ref 96–112)
Creatinine, Ser: 0.81 mg/dL (ref 0.40–1.20)
GFR: 73.44 mL/min (ref >60.00)
Glucose, Bld: 91 mg/dL (ref 70–99)
Potassium: 4.2 mEq/L (ref 3.5–5.1)
Sodium: 138 mEq/L (ref 135–145)
Total Bilirubin: 0.5 mg/dL (ref 0.2–1.2)
Total Protein: 6.9 g/dL (ref 6.0–8.3)

## 2021-11-02 DIAGNOSIS — M1611 Unilateral primary osteoarthritis, right hip: Secondary | ICD-10-CM | POA: Diagnosis not present

## 2021-11-18 ENCOUNTER — Other Ambulatory Visit: Payer: Self-pay | Admitting: Physician Assistant

## 2021-11-18 DIAGNOSIS — Z1231 Encounter for screening mammogram for malignant neoplasm of breast: Secondary | ICD-10-CM

## 2021-12-14 DIAGNOSIS — Z5189 Encounter for other specified aftercare: Secondary | ICD-10-CM | POA: Diagnosis not present

## 2022-01-07 ENCOUNTER — Ambulatory Visit
Admission: RE | Admit: 2022-01-07 | Discharge: 2022-01-07 | Disposition: A | Discharge status: Home / Self Care | Payer: Medicare Other | Source: Ambulatory Visit | Attending: Physician Assistant | Admitting: Physician Assistant

## 2022-01-07 DIAGNOSIS — Z1231 Encounter for screening mammogram for malignant neoplasm of breast: Secondary | ICD-10-CM

## 2022-02-04 ENCOUNTER — Encounter: Payer: Self-pay | Admitting: Internal Medicine

## 2022-02-04 ENCOUNTER — Ambulatory Visit (INDEPENDENT_AMBULATORY_CARE_PROVIDER_SITE_OTHER): Payer: Medicare Other | Admitting: Internal Medicine

## 2022-02-04 VITALS — BP 142/82 | HR 68 | Temp 98.4°F | Resp 18 | Ht 68.0 in | Wt 182.5 lb

## 2022-02-04 DIAGNOSIS — J101 Influenza due to other identified influenza virus with other respiratory manifestations: Secondary | ICD-10-CM

## 2022-02-04 LAB — POCT INFLUENZA A/B
Influenza A, POC: POSITIVE — AB
Influenza B, POC: NEGATIVE

## 2022-02-04 LAB — POC COVID19 BINAXNOW: SARS Coronavirus 2 Ag: NEGATIVE

## 2022-02-04 MED ORDER — GUAIFENESIN-CODEINE 100-10 MG/5ML PO SOLN: 5.0000 mL | Freq: Every evening | ORAL | 0 refills | Status: DC | PRN

## 2022-02-04 MED ORDER — AZITHROMYCIN 250 MG PO TABS: ORAL_TABLET | ORAL | 0 refills | Status: DC

## 2022-02-04 NOTE — Progress Notes (Unsigned)
   Subjective:    Patient ID: Brittney Stark, female    DOB: 05-May-1950, 72 y.o.   MRN: [id number]  DOS:  02/04/2022 Type of visit - description: Acute  Symptoms started 4 days ago: "Deep" cough, headaches, aches and pains on and off. Apparently had fever 1 time with the onset of symptoms but otherwise afebrile. Denies sore throat or ear pain No sputum production, no quizzing but she feels some rattling in the chest. + Postnasal dripping  Review of Systems See above   Past Medical History:  Diagnosis Date   Allergy    Arthritis    Seasonal allergies     Past Surgical History:  Procedure Laterality Date   COLONOSCOPY     HIP ARTHROPLASTY Left    04/13/21 - Dr. Wynelle Link   TOTAL VAGINAL HYSTERECTOMY     WISDOM TOOTH EXTRACTION      Current Outpatient Medications  Medication Instructions   azithromycin (ZITHROMAX Z-PAK) 250 MG tablet 2 tabs a day the first day, then 1 tab a day x 4 days   guaiFENesin-codeine 100-10 MG/5ML syrup 5 mLs, Oral, At bedtime PRN       Objective:   Physical Exam BP (!) 142/82   Pulse 68   Temp 98.4 F (36.9 C) (Oral)   Resp 18   Ht '5\' 8"'$  (1.727 m)   Wt 182 lb 8 oz (82.8 kg)   SpO2 98%   BMI 27.75 kg/m  General:   Well developed, NAD, BMI noted. HEENT:  Normocephalic . Face symmetric, atraumatic. TMs: Normal Nose slightly congested Throat: Symmetric, no red Lungs:  Few rhonchi without crackles or quizzing. Normal respiratory effort, no intercostal retractions, no accessory muscle use. Heart: RRR,  no murmur.  Lower extremities: no pretibial edema bilaterally  Skin: Not pale. Not jaundice Neurologic:  alert & oriented X3.  Speech normal, gait appropriate for age and unassisted Psych--  Cognition and judgment appear intact.  Cooperative with normal attention span and concentration.  Behavior appropriate. No anxious or depressed appearing.      Assessment   72 year old female, history of DJD, presents with.  Bronchitis: Symptoms  started 4 days ago. COVID-negative, but flu test came back (+).  She is outside the window for Tamiflu. Nontoxic-appearing but he has some rhonchi throughout. Plan: Conservative treatment with Mucinex, rest, fluids, Tylenol. If not better, start a Z-Pak in the next 2 to 3 days. See AVS Addendum: Request a codeine-based syrup for cough control at night, sent.  PDMP was okay.

## 2022-02-04 NOTE — Patient Instructions (Signed)
  Rest, fluids , tylenol  For cough:  Take Mucinex DM or Robitussin-DM OTC.  Follow the instructions in the box.   For nasal congestion: -Use over-the-counter Flonase: 2 nasal sprays on each side of the nose in the morning until you feel better  -Use OTC Astepro 2 nasal sprays on each side of the nose twice daily until better  Avoid decongestants such as  Pseudoephedrine or phenylephrine   If in the next 2 to 3 days you are not improving, please start taking the antibiotic called Zithromax.   Call if not gradually better over the next  10 days   Call anytime if the symptoms are severe, you have high fever, short of breath, chest pain

## 2022-02-28 ENCOUNTER — Ambulatory Visit (INDEPENDENT_AMBULATORY_CARE_PROVIDER_SITE_OTHER): Payer: Medicare Other

## 2022-02-28 VITALS — Wt 178.0 lb

## 2022-02-28 DIAGNOSIS — Z0000 Encounter for general adult medical examination without abnormal findings: Secondary | ICD-10-CM | POA: Diagnosis not present

## 2022-02-28 NOTE — Patient Instructions (Signed)
Brittney Stark , Thank you for taking time to come for your Medicare Wellness Visit. I appreciate your ongoing commitment to your health goals. Please review the following plan we discussed and let me know if I can assist you in the future.   These are the goals we discussed:  Goals      Patient Stated     LOSE 10LBS      Patient Stated     Lose weight      Patient Stated     Patient Stated     Working on getting weight down         This is a list of the screening recommended for you and due dates:  Health Maintenance  Topic Date Due   Colon Cancer Screening  02/06/2022   Medicare Annual Wellness Visit  03/01/2023   Mammogram  01/08/2024   DTaP/Tdap/Td vaccine (2 - Td or Tdap) 08/07/2027   DEXA scan (bone density measurement)  Completed   Hepatitis C Screening: USPSTF Recommendation to screen - Ages 66-79 yo.  Completed   HPV Vaccine  Aged Out   Pneumonia Vaccine  Discontinued   Flu Shot  Discontinued   COVID-19 Vaccine  Discontinued   Zoster (Shingles) Vaccine  Discontinued    Advanced directives: Please bring a copy of your health care power of attorney and living will to the office at your convenience.  Conditions/risks identified: work on getting weight down   Next appointment: Follow up in one year for your annual wellness visit    Preventive Care 65 Years and Older, Female Preventive care refers to lifestyle choices and visits with your health care provider that can promote health and wellness. What does preventive care include? A yearly physical exam. This is also called an annual well check. Dental exams once or twice a year. Routine eye exams. Ask your health care provider how often you should have your eyes checked. Personal lifestyle choices, including: Daily care of your teeth and gums. Regular physical activity. Eating a healthy diet. Avoiding tobacco and drug use. Limiting alcohol use. Practicing safe sex. Taking low-dose aspirin every day. Taking  vitamin and mineral supplements as recommended by your health care provider. What happens during an annual well check? The services and screenings done by your health care provider during your annual well check will depend on your age, overall health, lifestyle risk factors, and family history of disease. Counseling  Your health care provider may ask you questions about your: Alcohol use. Tobacco use. Drug use. Emotional well-being. Home and relationship well-being. Sexual activity. Eating habits. History of falls. Memory and ability to understand (cognition). Work and work Statistician. Reproductive health. Screening  You may have the following tests or measurements: Height, weight, and BMI. Blood pressure. Lipid and cholesterol levels. These may be checked every 5 years, or more frequently if you are over 40 years old. Skin check. Lung cancer screening. You may have this screening every year starting at age 34 if you have a 30-pack-year history of smoking and currently smoke or have quit within the past 15 years. Fecal occult blood test (FOBT) of the stool. You may have this test every year starting at age 34. Flexible sigmoidoscopy or colonoscopy. You may have a sigmoidoscopy every 5 years or a colonoscopy every 10 years starting at age 24. Hepatitis C blood test. Hepatitis B blood test. Sexually transmitted disease (STD) testing. Diabetes screening. This is done by checking your blood sugar (glucose) after you have not eaten  for a while (fasting). You may have this done every 1-3 years. Bone density scan. This is done to screen for osteoporosis. You may have this done starting at age 62. Mammogram. This may be done every 1-2 years. Talk to your health care provider about how often you should have regular mammograms. Talk with your health care provider about your test results, treatment options, and if necessary, the need for more tests. Vaccines  Your health care provider may  recommend certain vaccines, such as: Influenza vaccine. This is recommended every year. Tetanus, diphtheria, and acellular pertussis (Tdap, Td) vaccine. You may need a Td booster every 10 years. Zoster vaccine. You may need this after age 68. Pneumococcal 13-valent conjugate (PCV13) vaccine. One dose is recommended after age 71. Pneumococcal polysaccharide (PPSV23) vaccine. One dose is recommended after age 28. Talk to your health care provider about which screenings and vaccines you need and how often you need them. This information is not intended to replace advice given to you by your health care provider. Make sure you discuss any questions you have with your health care provider. Document Released: 02/13/2015 Document Revised: 10/07/2015 Document Reviewed: 11/18/2014 Elsevier Interactive Patient Education  2017 [place] Prevention in the Home Falls can cause injuries. They can happen to people of all ages. There are many things you can do to make your home safe and to help prevent falls. What can I do on the outside of my home? Regularly fix the edges of walkways and driveways and fix any cracks. Remove anything that might make you trip as you walk through a door, such as a raised step or threshold. Trim any bushes or trees on the path to your home. Use bright outdoor lighting. Clear any walking paths of anything that might make someone trip, such as rocks or tools. Regularly check to see if handrails are loose or broken. Make sure that both sides of any steps have handrails. Any raised decks and porches should have guardrails on the edges. Have any leaves, snow, or ice cleared regularly. Use sand or salt on walking paths during winter. Clean up any spills in your garage right away. This includes oil or grease spills. What can I do in the bathroom? Use night lights. Install grab bars by the toilet and in the tub and shower. Do not use towel bars as grab bars. Use non-skid  mats or decals in the tub or shower. If you need to sit down in the shower, use a plastic, non-slip stool. Keep the floor dry. Clean up any water that spills on the floor as soon as it happens. Remove soap buildup in the tub or shower regularly. Attach bath mats securely with double-sided non-slip rug tape. Do not have throw rugs and other things on the floor that can make you trip. What can I do in the bedroom? Use night lights. Make sure that you have a light by your bed that is easy to reach. Do not use any sheets or blankets that are too big for your bed. They should not hang down onto the floor. Have a firm chair that has side arms. You can use this for support while you get dressed. Do not have throw rugs and other things on the floor that can make you trip. What can I do in the kitchen? Clean up any spills right away. Avoid walking on wet floors. Keep items that you use a lot in easy-to-reach places. If you need to reach something  above you, use a strong step stool that has a grab bar. Keep electrical cords out of the way. Do not use floor polish or wax that makes floors slippery. If you must use wax, use non-skid floor wax. Do not have throw rugs and other things on the floor that can make you trip. What can I do with my stairs? Do not leave any items on the stairs. Make sure that there are handrails on both sides of the stairs and use them. Fix handrails that are broken or loose. Make sure that handrails are as long as the stairways. Check any carpeting to make sure that it is firmly attached to the stairs. Fix any carpet that is loose or worn. Avoid having throw rugs at the top or bottom of the stairs. If you do have throw rugs, attach them to the floor with carpet tape. Make sure that you have a light switch at the top of the stairs and the bottom of the stairs. If you do not have them, ask someone to add them for you. What else can I do to help prevent falls? Wear shoes  that: Do not have high heels. Have rubber bottoms. Are comfortable and fit you well. Are closed at the toe. Do not wear sandals. If you use a stepladder: Make sure that it is fully opened. Do not climb a closed stepladder. Make sure that both sides of the stepladder are locked into place. Ask someone to hold it for you, if possible. Clearly mark and make sure that you can see: Any grab bars or handrails. First and last steps. Where the edge of each step is. Use tools that help you move around (mobility aids) if they are needed. These include: Canes. Walkers. Scooters. Crutches. Turn on the lights when you go into a dark area. Replace any light bulbs as soon as they burn out. Set up your furniture so you have a clear path. Avoid moving your furniture around. If any of your floors are uneven, fix them. If there are any pets around you, be aware of where they are. Review your medicines with your doctor. Some medicines can make you feel dizzy. This can increase your chance of falling. Ask your doctor what other things that you can do to help prevent falls. This information is not intended to replace advice given to you by your health care provider. Make sure you discuss any questions you have with your health care provider. Document Released: 11/13/2008 Document Revised: 06/25/2015 Document Reviewed: 02/21/2014 Elsevier Interactive Patient Education  2017 [employer].

## 2022-02-28 NOTE — Progress Notes (Signed)
I connected with  Brittney Stark on 02/28/22 by a audio enabled telemedicine application and verified that I am speaking with the correct person using two identifiers.  Patient Location: Home  Provider Location: Office/Clinic  I discussed the limitations of evaluation and management by telemedicine. The patient expressed understanding and agreed to proceed.   Subjective:   Brittney Stark is a 72 y.o. female who presents for Medicare Annual (Subsequent) preventive examination.  Review of Systems     Cardiac Risk Factors include: advanced age (>27mn, >>21women)     Objective:    Today's Vitals   02/28/22 1006  Weight: 178 lb (80.7 kg)   Body mass index is 27.06 kg/m.     02/28/2022   10:14 AM 02/15/2021   10:15 AM 02/07/2020   12:02 PM  Advanced Directives  Does Patient Have a Medical Advance Directive? Yes Yes Yes  Type of [place] will Healthcare Power of [place]Living will  Copy of HFairviewin Chart? No - copy requested No - copy requested No - copy requested    Current Medications (verified) Outpatient Encounter Medications as of 02/28/2022  Medication Sig   Ascorbic Acid (VITAMIN C PO) Take by mouth.   BIOTIN PO Take by mouth.   Calcium Carb-Cholecalciferol (CALCIUM 500 + D PO) Take by mouth. With vit d3   Magnesium 250 MG TABS Take by mouth.   TURMERIC PO Take by mouth.   [DISCONTINUED] azithromycin (ZITHROMAX Z-PAK) 250 MG tablet 2 tabs a day the first day, then 1 tab a day x 4 days   [DISCONTINUED] guaiFENesin-codeine 100-10 MG/5ML syrup Take 5 mLs by mouth at bedtime as needed for cough.   No facility-administered encounter medications on file as of 02/28/2022.    Allergies (verified) Patient has no known allergies.   History: Past Medical History:  Diagnosis Date   Allergy    Arthritis    Seasonal allergies    Past Surgical History:  Procedure Laterality Date    COLONOSCOPY     HIP ARTHROPLASTY Left    04/13/21 - Dr. AWynelle Link  TOTAL VAGINAL HYSTERECTOMY     WISDOM TOOTH EXTRACTION     Family History  Problem Relation Age of Onset   Stroke Mother    Heart disease Mother    Diabetes Mother    Colon polyps Brother    Colon cancer Neg Hx    Esophageal cancer Neg Hx    Rectal cancer Neg Hx    Stomach cancer Neg Hx    Social History   Socioeconomic History   Marital status: Unknown    Spouse name: Not on file   Number of children: Not on file   Years of education: Not on file   Highest education level: Not on file  Occupational History   Occupation: semi retired   Tobacco Use   Smoking status: Never   Smokeless tobacco: Never  Substance and Sexual Activity   Alcohol use: Yes    Alcohol/week: 3.0 standard drinks of alcohol    Types: 3 Glasses of wine per week   Drug use: No   Sexual activity: Never  Other Topics Concern   Not on file  Social History Narrative   Lost > 65 pounds using [name]    Social Determinants of Health   Financial Resource Strain: Low Risk  (02/28/2022)   Overall Financial Resource Strain (CARDIA)    Difficulty of Paying  Living Expenses: Not hard at all  Food Insecurity: No Food Insecurity (02/28/2022)   Hunger Vital Sign    Worried About Running Out of Food in the Last Year: Never true    Ran Out of Food in the Last Year: Never true  Transportation Needs: No Transportation Needs (02/28/2022)   PRAPARE - Hydrologist (Medical): No    Lack of Transportation (Non-Medical): No  Physical Activity: Sufficiently Active (02/28/2022)   Exercise Vital Sign    Days of Exercise per Week: 7 days    Minutes of Exercise per Session: 30 min  Stress: No Stress Concern Present (02/28/2022)   [place]    Feeling of Stress : Not at all  Social Connections: [place] (02/28/2022)   Social Connection and Isolation  Panel [NHANES]    Frequency of Communication with Friends and Family: More than three times a week    Frequency of Social Gatherings with Friends and Family: More than three times a week    Attends Religious Services: 1 to 4 times per year    Active Member of [employer] or Organizations: Yes    Attends Archivist Meetings: 1 to 4 times per year    Marital Status: Living with partner    Tobacco Counseling Counseling given: Not Answered   Clinical Intake:  Pre-visit preparation completed: Yes  Pain : No/denies pain     BMI - recorded: 27.06 Nutritional Status: BMI 25 -29 Overweight Nutritional Risks: None Diabetes: No  How often do you need to have someone help you when you read instructions, pamphlets, or other written materials from your doctor or pharmacy?: 1 - Never  Diabetic?no   Interpreter Needed?: No  Information entered by :: [name], LPN   Activities of Daily Living    02/28/2022   10:15 AM  In your present state of health, do you have any difficulty performing the following activities:  Hearing? 0  Vision? 0  Difficulty concentrating or making decisions? 0  Walking or climbing stairs? 0  Dressing or bathing? 0  Doing errands, shopping? 0  Preparing Food and eating ? N  Using the Toilet? N  In the past six months, have you accidently leaked urine? N  Do you have problems with loss of bowel control? N  Managing your Medications? N  Managing your Finances? N  Housekeeping or managing your Housekeeping? N    Patient Care Team: Allwardt, Randa Evens, PA-C as PCP - General (Physician Assistant) Tiajuana Amass, MD as Referring Physician (Allergy and Immunology)  Indicate any recent Medical Services you may have received from other than [facility] providers in the past year (date may be approximate).     Assessment:   This is a routine wellness examination for Brittney Stark.  Hearing/Vision screen Hearing Screening - Comments:: Pt denies any hearing issues   Vision Screening - Comments:: Pt follows up with Dr Katy Fitch for annual eye exams   Dietary issues and exercise activities discussed: Current Exercise Habits: Home exercise routine, Type of exercise: walking;Other - see comments, Time (Minutes): 30, Frequency (Times/Week): 7, Weekly Exercise (Minutes/Week): 210   Goals Addressed             This Visit's Progress    Patient Stated       Patient Stated       Working on getting weight down        Depression Screen    02/28/2022  10:12 AM 02/04/2022    2:30 PM 02/15/2021   10:13 AM 10/27/2020    9:34 AM 02/07/2020   11:59 AM 09/11/2019    1:08 PM 01/04/2019    8:48 AM  PHQ 2/9 Scores  PHQ - 2 Score 0 0 0 0 0 0 0    Fall Risk    02/28/2022   10:15 AM 02/04/2022    2:30 PM 10/15/2021    1:59 PM 02/15/2021   10:16 AM 10/27/2020    9:33 AM  Fall Risk   Falls in the past year? 0 0 0 0 0  Number falls in past yr: 0 0 0 0 0  Injury with Fall? 0 0 0 0 0  Risk for fall due to : Impaired vision  No Fall Risks Impaired mobility No Fall Risks  Risk for fall due to: Comment    rebalance for hip issues   Follow up Falls prevention discussed Falls evaluation completed Falls evaluation completed Falls prevention discussed Falls evaluation completed    FALL RISK PREVENTION PERTAINING TO THE HOME:  Any stairs in or around the home? Yes  If so, are there any without handrails? No  Home free of loose throw rugs in walkways, pet beds, electrical cords, etc? Yes  Adequate lighting in your home to reduce risk of falls? Yes   ASSISTIVE DEVICES UTILIZED TO PREVENT FALLS:  Life alert? No  Use of a cane, walker or w/c? No  Grab bars in the bathroom? No  Shower chair or bench in shower? No  Elevated toilet seat or a handicapped toilet? No   TIMED UP AND GO:  Was the test performed? No .   Cognitive Function:        02/28/2022   10:15 AM 02/15/2021   10:18 AM 02/07/2020   12:05 PM  6CIT Screen  What Year? 0 points 0 points 0 points  What  month? 0 points 0 points 0 points  What time? 0 points 0 points   Count back from 20 0 points 0 points 0 points  Months in reverse 0 points 0 points 0 points  Repeat phrase 0 points 0 points 0 points  Total Score 0 points 0 points     Immunizations Immunization History  Administered Date(s) Administered   PFIZER(Purple Top)SARS-COV-2 Vaccination 04/04/2019, 05/01/2019   Tdap 08/06/2017    TDAP status: Up to date  Flu Vaccine status: Declined, Education has been provided regarding the importance of this vaccine but patient still declined. Advised may receive this vaccine at local pharmacy or Health Dept. Aware to provide a copy of the vaccination record if obtained from local pharmacy or Health Dept. Verbalized acceptance and understanding.  Pneumococcal vaccine status: Declined,  Education has been provided regarding the importance of this vaccine but patient still declined. Advised may receive this vaccine at local pharmacy or Health Dept. Aware to provide a copy of the vaccination record if obtained from local pharmacy or Health Dept. Verbalized acceptance and understanding.   Covid-19 vaccine status: Completed vaccines  Qualifies for Shingles Vaccine? Yes   Zostavax completed No   Shingrix Completed?: No.    Education has been provided regarding the importance of this vaccine. Patient has been advised to call insurance company to determine out of pocket expense if they have not yet received this vaccine. Advised may also receive vaccine at local pharmacy or Health Dept. Verbalized acceptance and understanding.  Screening Tests Health Maintenance  Topic Date Due   COLONOSCOPY (Pts 45-58yr  Insurance coverage will need to be confirmed)  02/06/2022   Medicare Annual Wellness (AWV)  03/01/2023   MAMMOGRAM  01/08/2024   DTaP/Tdap/Td (2 - Td or Tdap) 08/07/2027   DEXA SCAN  Completed   Hepatitis C Screening  Completed   HPV VACCINES  Aged Out   Pneumonia Vaccine 63+ Years old   Discontinued   INFLUENZA VACCINE  Discontinued   COVID-19 Vaccine  Discontinued   Zoster Vaccines- Shingrix  Discontinued    Health Maintenance  Health Maintenance Due  Topic Date Due   COLONOSCOPY (Pts 45-7yr Insurance coverage will need to be confirmed)  02/06/2022    Colorectal cancer screening: Referral to GI placed 1/29/. Pt aware the office will call re: appt.  Mammogram status: Completed 01/07/22. Repeat every year  Bone Density status: Completed 02/15/17. Results reflect: Bone density results: OSTEOPENIA. Repeat every 2 years.  Additional Screening:  Hepatitis C Screening:  Completed 01/04/19  Vision Screening: Recommended annual ophthalmology exams for early detection of glaucoma and other disorders of the eye. Is the patient up to date with their annual eye exam?  Yes  Who is the provider or what is the name of the office in which the patient attends annual eye exams? Dr GKaty Fitch If pt is not established with a provider, would they like to be referred to a provider to establish care? No .   Dental Screening: Recommended annual dental exams for proper oral hygiene  Community Resource Referral / Chronic Care Management: CRR required this visit?  No   CCM required this visit?  No      Plan:     I have personally reviewed and noted the following in the patient's chart:   Medical and social history Use of alcohol, tobacco or illicit drugs  Current medications and supplements including opioid prescriptions. Patient is not currently taking opioid prescriptions. Functional ability and status Nutritional status Physical activity Advanced directives List of other physicians Hospitalizations, surgeries, and ER visits in previous 12 months Vitals Screenings to include cognitive, depression, and falls Referrals and appointments  In addition, I have reviewed and discussed with patient certain preventive protocols, quality metrics, and best practice recommendations. A  written personalized care plan for preventive services as well as general preventive health recommendations were provided to patient.     TWillette Brace LPN   [id number]  Nurse Notes: none

## 2022-03-30 DIAGNOSIS — G43109 Migraine with aura, not intractable, without status migrainosus: Secondary | ICD-10-CM | POA: Diagnosis not present

## 2022-06-15 DIAGNOSIS — Z96641 Presence of right artificial hip joint: Secondary | ICD-10-CM | POA: Diagnosis not present

## 2022-07-14 DIAGNOSIS — H40013 Open angle with borderline findings, low risk, bilateral: Secondary | ICD-10-CM | POA: Diagnosis not present

## 2022-07-14 DIAGNOSIS — H2513 Age-related nuclear cataract, bilateral: Secondary | ICD-10-CM | POA: Diagnosis not present

## 2022-07-14 DIAGNOSIS — H16213 Exposure keratoconjunctivitis, bilateral: Secondary | ICD-10-CM | POA: Diagnosis not present

## 2022-07-14 DIAGNOSIS — H04123 Dry eye syndrome of bilateral lacrimal glands: Secondary | ICD-10-CM | POA: Diagnosis not present

## 2022-07-14 DIAGNOSIS — G43109 Migraine with aura, not intractable, without status migrainosus: Secondary | ICD-10-CM | POA: Diagnosis not present

## 2022-07-28 DIAGNOSIS — Z471 Aftercare following joint replacement surgery: Secondary | ICD-10-CM | POA: Diagnosis not present

## 2022-07-28 DIAGNOSIS — Z96641 Presence of right artificial hip joint: Secondary | ICD-10-CM | POA: Diagnosis not present

## 2022-07-28 DIAGNOSIS — M545 Low back pain, unspecified: Secondary | ICD-10-CM | POA: Diagnosis not present

## 2022-08-09 DIAGNOSIS — M545 Low back pain, unspecified: Secondary | ICD-10-CM | POA: Diagnosis not present

## 2022-11-28 ENCOUNTER — Other Ambulatory Visit: Payer: Self-pay | Admitting: Physician Assistant

## 2022-11-28 DIAGNOSIS — Z1231 Encounter for screening mammogram for malignant neoplasm of breast: Secondary | ICD-10-CM

## 2023-01-09 ENCOUNTER — Ambulatory Visit
Admission: RE | Admit: 2023-01-09 | Discharge: 2023-01-09 | Disposition: A | Discharge status: Home / Self Care | Payer: Medicare Other | Source: Ambulatory Visit | Attending: Physician Assistant | Admitting: Physician Assistant

## 2023-01-09 DIAGNOSIS — Z1231 Encounter for screening mammogram for malignant neoplasm of breast: Secondary | ICD-10-CM

## 2023-01-18 ENCOUNTER — Ambulatory Visit: Payer: Medicare Other | Admitting: Physician Assistant

## 2023-01-18 ENCOUNTER — Encounter: Payer: Self-pay | Admitting: Physician Assistant

## 2023-01-18 VITALS — BP 115/76 | HR 64 | Temp 97.1°F | Ht 68.0 in | Wt 182.2 lb

## 2023-01-18 DIAGNOSIS — M8580 Other specified disorders of bone density and structure, unspecified site: Secondary | ICD-10-CM | POA: Diagnosis not present

## 2023-01-18 DIAGNOSIS — E538 Deficiency of other specified B group vitamins: Secondary | ICD-10-CM

## 2023-01-18 DIAGNOSIS — Z860100 Personal history of colon polyps, unspecified: Secondary | ICD-10-CM

## 2023-01-18 DIAGNOSIS — E559 Vitamin D deficiency, unspecified: Secondary | ICD-10-CM

## 2023-01-18 DIAGNOSIS — Z780 Asymptomatic menopausal state: Secondary | ICD-10-CM | POA: Diagnosis not present

## 2023-01-18 DIAGNOSIS — Z0000 Encounter for general adult medical examination without abnormal findings: Secondary | ICD-10-CM

## 2023-01-18 DIAGNOSIS — Z13220 Encounter for screening for lipoid disorders: Secondary | ICD-10-CM

## 2023-01-18 DIAGNOSIS — L659 Nonscarring hair loss, unspecified: Secondary | ICD-10-CM

## 2023-01-18 LAB — COMPREHENSIVE METABOLIC PANEL
ALT: 13 U/L (ref 0–35)
AST: 18 U/L (ref 0–37)
Albumin: 4.5 g/dL (ref 3.5–5.2)
Alkaline Phosphatase: 58 U/L (ref 39–117)
BUN: 23 mg/dL (ref 6–23)
CO2: 28 meq/L (ref 19–32)
Calcium: 9.2 mg/dL (ref 8.4–10.5)
Chloride: 105 meq/L (ref 96–112)
Creatinine, Ser: 0.81 mg/dL (ref 0.40–1.20)
GFR: 72.81 mL/min (ref >60.00)
Glucose, Bld: 96 mg/dL (ref 70–99)
Potassium: 4.4 meq/L (ref 3.5–5.1)
Sodium: 140 meq/L (ref 135–145)
Total Bilirubin: 0.5 mg/dL (ref 0.2–1.2)
Total Protein: 6.5 g/dL (ref 6.0–8.3)

## 2023-01-18 LAB — CBC WITH DIFFERENTIAL/PLATELET
Basophils Absolute: 0 10*3/uL (ref 0.0–0.1)
Basophils Relative: 1.2 % (ref 0.0–3.0)
Eosinophils Absolute: 0.1 10*3/uL (ref 0.0–0.7)
Eosinophils Relative: 2.6 % (ref 0.0–5.0)
HCT: 39.7 % (ref 36.0–46.0)
Hemoglobin: 13.3 g/dL (ref 12.0–15.0)
Lymphocytes Relative: 40.1 % (ref 12.0–46.0)
Lymphs Abs: 1.4 10*3/uL (ref 0.7–4.0)
MCHC: 33.4 g/dL (ref 30.0–36.0)
MCV: 104.8 fL — ABNORMAL HIGH (ref 78.0–100.0)
Monocytes Absolute: 0.3 10*3/uL (ref 0.1–1.0)
Monocytes Relative: 8.5 % (ref 3.0–12.0)
Neutro Abs: 1.6 10*3/uL (ref 1.4–7.7)
Neutrophils Relative %: 47.6 % (ref 43.0–77.0)
Platelets: 276 10*3/uL (ref 150.0–400.0)
RBC: 3.79 Mil/uL — ABNORMAL LOW (ref 3.87–5.11)
RDW: 13.3 % (ref 11.5–15.5)
WBC: 3.5 10*3/uL — ABNORMAL LOW (ref 4.0–10.5)

## 2023-01-18 LAB — VITAMIN B12: Vitamin B-12: 326 pg/mL (ref 211–911)

## 2023-01-18 LAB — VITAMIN D 25 HYDROXY (VIT D DEFICIENCY, FRACTURES): VITD: 33.51 ng/mL (ref 30.00–100.00)

## 2023-01-18 LAB — LIPID PANEL
Cholesterol: 185 mg/dL (ref 0–200)
HDL: 69.5 mg/dL (ref >39.00)
LDL Cholesterol: 98 mg/dL (ref 0–99)
NonHDL: 115.1
Total CHOL/HDL Ratio: 3
Triglycerides: 88 mg/dL (ref 0.0–149.0)
VLDL: 17.6 mg/dL (ref 0.0–40.0)

## 2023-01-18 LAB — TSH: TSH: 0.44 u[IU]/mL (ref 0.35–5.50)

## 2023-01-18 NOTE — Patient Instructions (Addendum)
Merry Christmas! Enjoy your grandbaby!

## 2023-01-18 NOTE — Progress Notes (Signed)
Patient ID: Brittney Stark, female    DOB: Feb 11, 1950, 72 y.o.   MRN: [id number]   Assessment & Plan:  Encounter for annual physical exam  Hair thinning -     CBC with Differential/Platelet -     Comprehensive metabolic panel -     TSH  B12 deficiency -     Vitamin B12  Vitamin D deficiency -     VITAMIN D 25 Hydroxy (Vit-D Deficiency, Fractures)  History of colonic polyps -     Ambulatory referral to Gastroenterology  Screening for cholesterol level -     Lipid panel  Osteopenia, unspecified location -     DG Bone Density; Future  Postmenopausal -     DG Bone Density; Future    Age-appropriate screening and counseling performed today. Will check labs and call with results. Preventive measures discussed and printed in AVS for patient.   Patient Counseling: [x]   Nutrition: Stressed importance of moderation in sodium/caffeine intake, saturated fat and cholesterol, caloric balance, sufficient intake of fresh fruits, vegetables, and fiber.  [x]   Stressed the importance of regular exercise.   []   Substance Abuse: Discussed cessation/primary prevention of tobacco, alcohol, or other drug use; driving or other dangerous activities under the influence; availability of treatment for abuse.   [x]   Injury prevention: Discussed safety belts, safety helmets, smoke detector, smoking near bedding or upholstery.   []   Sexuality: Discussed sexually transmitted diseases, partner selection, use of condoms, avoidance of unintended pregnancy  and contraceptive alternatives.   [x]   Dental health: Discussed importance of regular tooth brushing, flossing, and dental visits.  [x]   Health maintenance and immunizations reviewed. Please refer to Health maintenance section.       Return in about 1 year (around 01/18/2024) for physical.    Subjective:    Chief Complaint  Patient presents with   Annual Exam    Pt here for annual exam - pt not fasting - no concerns, pt due for colonoscopy      HPI Patient is in today for annual exam. She had her hip replacement, otherwise no medical changes. 72 yo new [place] in [place].   Acute concerns: Hair thinning - takes multivitamin, tumeric, and magnesium   Health maintenance: Lifestyle/ exercise: walking regularly Nutrition: doing well  Mental health: doing well Sleep: doing well Substance use: none  Colonoscopy: due, ordered today  Mammogram: UTD  DEXA: osteopenia, noted in 2019, reordered today   Skin: doing well, wears sunscreen    Past Medical History:  Diagnosis Date   Allergy    Arthritis    Seasonal allergies     Past Surgical History:  Procedure Laterality Date   COLONOSCOPY     HIP ARTHROPLASTY Left    04/13/21 - Dr. Lequita Halt   TOTAL VAGINAL HYSTERECTOMY     WISDOM TOOTH EXTRACTION      Family History  Problem Relation Age of Onset   Stroke Mother    Heart disease Mother    Diabetes Mother    Colon polyps Brother    Colon cancer Neg Hx    Esophageal cancer Neg Hx    Rectal cancer Neg Hx    Stomach cancer Neg Hx     Social History   Tobacco Use   Smoking status: Never   Smokeless tobacco: Never  Substance Use Topics   Alcohol use: Yes    Alcohol/week: 3.0 standard drinks of alcohol    Types: 3 Glasses of wine per week  Drug use: No     No Known Allergies  Review of Systems NEGATIVE UNLESS OTHERWISE INDICATED IN HPI      Objective:     BP 115/76   Pulse 64   Temp (!) 97.1 F (36.2 C)   Ht 5\' 8"  (1.727 m)   Wt 182 lb 3.2 oz (82.6 kg)   SpO2 98%   BMI 27.70 kg/m   Wt Readings from Last 3 Encounters:  01/18/23 182 lb 3.2 oz (82.6 kg)  02/28/22 178 lb (80.7 kg)  02/04/22 182 lb 8 oz (82.8 kg)    BP Readings from Last 3 Encounters:  01/18/23 115/76  02/04/22 (!) 142/82  10/15/21 108/71     Physical Exam Vitals and nursing note reviewed.  Constitutional:      Appearance: Normal appearance. She is normal weight. She is not toxic-appearing.  HENT:     Head:  Normocephalic and atraumatic.     Right Ear: Tympanic membrane, ear canal and external ear normal.     Left Ear: Tympanic membrane, ear canal and external ear normal.     Nose: Nose normal.     Mouth/Throat:     Mouth: Mucous membranes are moist.  Eyes:     Extraocular Movements: Extraocular movements intact.     Conjunctiva/sclera: Conjunctivae normal.     Pupils: Pupils are equal, round, and reactive to light.  Cardiovascular:     Rate and Rhythm: Normal rate and regular rhythm.     Pulses: Normal pulses.     Heart sounds: Normal heart sounds.  Pulmonary:     Effort: Pulmonary effort is normal.     Breath sounds: Normal breath sounds.  Abdominal:     General: Abdomen is flat. Bowel sounds are normal.     Palpations: Abdomen is soft.  Musculoskeletal:        General: Normal range of motion.     Cervical back: Normal range of motion and neck supple.  Skin:    General: Skin is warm and dry.     Findings: No lesion or rash.     Comments: Lipoma R forearm  Neurological:     General: No focal deficit present.     Mental Status: She is alert and oriented to person, place, and time.  Psychiatric:        Mood and Affect: Mood normal.        Behavior: Behavior normal.        Thought Content: Thought content normal.        Judgment: Judgment normal.          Alyssandra Hulsebus M Makaela Cando, PA-C

## 2023-01-26 ENCOUNTER — Encounter: Payer: Self-pay | Admitting: Gastroenterology

## 2023-02-21 ENCOUNTER — Ambulatory Visit (AMBULATORY_SURGERY_CENTER): Payer: Medicare Other

## 2023-02-21 VITALS — Ht 68.0 in | Wt 175.0 lb

## 2023-02-21 DIAGNOSIS — Z860100 Personal history of colon polyps, unspecified: Secondary | ICD-10-CM

## 2023-02-21 DIAGNOSIS — Z800 Family history of malignant neoplasm of digestive organs: Secondary | ICD-10-CM

## 2023-02-21 DIAGNOSIS — Z83719 Family history of colon polyps, unspecified: Secondary | ICD-10-CM

## 2023-02-21 MED ORDER — SUTAB 1479-225-188 MG PO TABS: 12.0000 | ORAL_TABLET | ORAL | 0 refills | Status: DC

## 2023-02-21 NOTE — Progress Notes (Signed)
No egg or soy allergy known to patient  No issues known to pt with past sedation with any surgeries or procedures Patient denies ever being told they had issues or difficulty with intubation  No FH of Malignant Hyperthermia Pt is not on diet pills Pt is not on  home 02  Pt is not on blood thinners  Pt denies issues with constipation  No A fib or A flutter Have any cardiac testing pending-- no  LOA: independent  Prep: 2 day sutab   Patient's chart reviewed by Cathlyn Parsons CNRA prior to previsit and patient appropriate for the [facility].  Previsit completed and red dot placed by patient's name on their procedure day (on provider's schedule).     PV competed with patient. Prep instructions sent via mychart and home address.

## 2023-03-06 ENCOUNTER — Ambulatory Visit (INDEPENDENT_AMBULATORY_CARE_PROVIDER_SITE_OTHER): Payer: Medicare Other

## 2023-03-06 VITALS — Wt 175.0 lb

## 2023-03-06 DIAGNOSIS — Z0000 Encounter for general adult medical examination without abnormal findings: Secondary | ICD-10-CM | POA: Diagnosis not present

## 2023-03-06 NOTE — Patient Instructions (Signed)
Brittney Stark , Thank you for taking time to come for your Medicare Wellness Visit. I appreciate your ongoing commitment to your health goals. Please review the following plan we discussed and let me know if I can assist you in the future.   Referrals/Orders/Follow-Ups/Clinician Recommendations: continue to work on losing weight   This is a list of the screening recommended for you and due dates:  Health Maintenance  Topic Date Due   Medicare Annual Wellness Visit  03/01/2023   Colon Cancer Screening  01/18/2024*   Mammogram  01/08/2025   DTaP/Tdap/Td vaccine (2 - Td or Tdap) 08/07/2027   DEXA scan (bone density measurement)  Completed   Hepatitis C Screening  Completed   HPV Vaccine  Aged Out   Pneumonia Vaccine  Discontinued   Flu Shot  Discontinued   COVID-19 Vaccine  Discontinued   Zoster (Shingles) Vaccine  Discontinued  *Topic was postponed. The date shown is not the original due date.    Advanced directives: (Copy Requested) Please bring a copy of your health care power of attorney and living will to the office to be added to your chart at your convenience.  Next Medicare Annual Wellness Visit scheduled for next year: Yes

## 2023-03-06 NOTE — Progress Notes (Signed)
Subjective:   Brittney Stark is a 73 y.o. female who presents for Medicare Annual (Subsequent) preventive examination.  Visit Complete: Virtual I connected with  Brittney Stark on 03/06/23 by a audio enabled telemedicine application and verified that I am speaking with the correct person using two identifiers.  Patient Location: Home  Provider Location: Office/Clinic  I discussed the limitations of evaluation and management by telemedicine. The patient expressed understanding and agreed to proceed.  Vital Signs: Because this visit was a virtual/telehealth visit, some criteria may be missing or patient reported. Any vitals not documented were not able to be obtained and vitals that have been documented are patient reported.   Cardiac Risk Factors include: advanced age (>62men, >59 women)     Objective:    Today's Vitals   03/06/23 1544  Weight: 175 lb (79.4 kg)   Body mass index is 26.61 kg/m.     03/06/2023    3:47 PM 02/28/2022   10:14 AM 02/15/2021   10:15 AM 02/07/2020   12:02 PM  Advanced Directives  Does Patient Have a Medical Advance Directive? Yes Yes Yes Yes  Type of Estate agent of [place];Living will Healthcare Power of Brittney Stark;Living will Healthcare Power of [employer] of Brittney Stark;Living will  Copy of Healthcare Power of Attorney in Chart? No - copy requested No - copy requested No - copy requested No - copy requested    Current Medications (verified) Outpatient Encounter Medications as of 03/06/2023  Medication Sig   Calcium Carb-Cholecalciferol (CALCIUM 500 + D PO) Take by mouth. With vit d3   Magnesium 250 MG TABS Take by mouth.   Multiple Vitamin (MULTIVITAMIN) capsule Take 1 capsule by mouth daily.   Sodium Sulfate-Mag Sulfate-KCl (SUTAB) [phone] MG TABS Take 12 tablets by mouth as directed.   TURMERIC PO Take by mouth.   [DISCONTINUED] Ascorbic Acid (VITAMIN C PO) Take by mouth. (Patient not taking: Reported on 02/21/2023)    [DISCONTINUED] BIOTIN PO Take by mouth. (Patient not taking: Reported on 02/21/2023)   No facility-administered encounter medications on file as of 03/06/2023.    Allergies (verified) Patient has no known allergies.   History: Past Medical History:  Diagnosis Date   Allergy    Arthritis    Seasonal allergies    Past Surgical History:  Procedure Laterality Date   COLONOSCOPY     HIP ARTHROPLASTY Left    04/13/21 - Dr. Lequita Halt   TOTAL VAGINAL HYSTERECTOMY     WISDOM TOOTH EXTRACTION     Family History  Problem Relation Age of Onset   Stroke Mother    Heart disease Mother    Diabetes Mother    Colon polyps Brother    Colon cancer Neg Hx    Esophageal cancer Neg Hx    Rectal cancer Neg Hx    Stomach cancer Neg Hx    Social History   Socioeconomic History   Marital status: Unknown    Spouse name: Not on file   Number of children: Not on file   Years of education: Not on file   Highest education level: Not on file  Occupational History   Occupation: semi retired   Tobacco Use   Smoking status: Never   Smokeless tobacco: Never  Substance and Sexual Activity   Alcohol use: Yes    Alcohol/week: 3.0 standard drinks of alcohol    Types: 3 Glasses of wine per week   Drug use: No   Sexual activity: Never  Other Topics Concern   Not on file  Social History Narrative   Lost > 65 pounds using Doylene Bode.    Social Drivers of Corporate investment banker Strain: Low Risk  (03/06/2023)   Overall Financial Resource Strain (CARDIA)    Difficulty of Paying Living Expenses: Not hard at all  Food Insecurity: No Food Insecurity (03/06/2023)   Hunger Vital Sign    Worried About Running Out of Food in the Last Year: Never true    Ran Out of Food in the Last Year: Never true  Transportation Needs: No Transportation Needs (03/06/2023)   PRAPARE - Administrator, Civil Service (Medical): No    Lack of Transportation (Non-Medical): No  Physical Activity: Sufficiently  Active (03/06/2023)   Exercise Vital Sign    Days of Exercise per Week: 5 days    Minutes of Exercise per Session: 30 min  Stress: No Stress Concern Present (03/06/2023)   Brittney Stark of Occupational Health - Occupational Stress Questionnaire    Feeling of Stress : Not at all  Social Connections: Socially Integrated (03/06/2023)   Social Connection and Isolation Panel [NHANES]    Frequency of Communication with Friends and Family: More than three times a week    Frequency of Social Gatherings with Friends and Family: More than three times a week    Attends Religious Services: More than 4 times per year    Active Member of [employer] or Organizations: Yes    Attends Banker Meetings: 1 to 4 times per year    Marital Status: Living with partner    Tobacco Counseling Counseling given: Not Answered   Clinical Intake:  Pre-visit preparation completed: Yes  Pain : No/denies pain     BMI - recorded: 26.61 Nutritional Status: BMI 25 -29 Overweight Nutritional Risks: None Diabetes: No  How often do you need to have someone help you when you read instructions, pamphlets, or other written materials from your doctor or pharmacy?: 1 - Never  Interpreter Needed?: No  Information entered by :: Brittney Ensign, LPN   Activities of Daily Living    03/06/2023    3:45 PM  In your present state of health, do you have any difficulty performing the following activities:  Hearing? 0  Vision? 0  Difficulty concentrating or making decisions? 0  Walking or climbing stairs? 0  Dressing or bathing? 0  Doing errands, shopping? 0  Preparing Food and eating ? N  Using the Toilet? N  In the past six months, have you accidently leaked urine? N  Do you have problems with loss of bowel control? N  Managing your Medications? N  Managing your Finances? N  Housekeeping or managing your Housekeeping? N    Patient Care Team: Brittney Stark, Brittney Infante, PA-C as PCP - General (Physician  Assistant) Brittney Stanford, MD as Referring Physician (Allergy and Immunology)  Indicate any recent Medical Services you may have received from other than [facility] providers in the past year (date may be approximate).     Assessment:   This is a routine wellness examination for [name].  Hearing/Vision screen Hearing Screening - Comments:: Pt denies any hearing issues  Vision Screening - Comments:: Pt follows up with Dr Dione Booze for annual eye exams    Goals Addressed             This Visit's Progress    Patient Stated       Lose weight  Depression Screen    03/06/2023    3:46 PM 01/18/2023    9:30 AM 02/28/2022   10:12 AM 02/04/2022    2:30 PM 02/15/2021   10:13 AM 10/27/2020    9:34 AM 02/07/2020   11:59 AM  PHQ 2/9 Scores  PHQ - 2 Score 0 0 0 0 0 0 0  PHQ- 9 Score 0 0         Fall Risk    03/06/2023    3:48 PM 01/18/2023    9:29 AM 02/28/2022   10:15 AM 02/04/2022    2:30 PM 10/15/2021    1:59 PM  Fall Risk   Falls in the past year? 0 0 0 0 0  Number falls in past yr: 0 0 0 0 0  Injury with Fall? 0 0 0 0 0  Risk for fall due to : No Fall Risks No Fall Risks Impaired vision  No Fall Risks  Follow up Falls prevention discussed Falls evaluation completed Falls prevention discussed Falls evaluation completed Falls evaluation completed    MEDICARE RISK AT HOME: Medicare Risk at Home Any stairs in or around the home?: Yes If so, are there any without handrails?: No Home free of loose throw rugs in walkways, pet beds, electrical cords, etc?: Yes Adequate lighting in your home to reduce risk of falls?: Yes Life alert?: No Use of a cane, walker or w/c?: No Grab bars in the bathroom?: No Shower chair or bench in shower?: Yes Elevated toilet seat or a handicapped toilet?: No  TIMED UP AND GO:  Was the test performed?  No    Cognitive Function:        03/06/2023    3:48 PM 02/28/2022   10:15 AM 02/15/2021   10:18 AM 02/07/2020   12:05 PM  6CIT Screen  What Year? 0 points 0  points 0 points 0 points  What month? 0 points 0 points 0 points 0 points  What time? 0 points 0 points 0 points   Count back from 20 0 points 0 points 0 points 0 points  Months in reverse 0 points 0 points 0 points 0 points  Repeat phrase 0 points 0 points 0 points 0 points  Total Score 0 points 0 points 0 points     Immunizations Immunization History  Administered Date(s) Administered   PFIZER(Purple Top)SARS-COV-2 Vaccination 04/04/2019, 05/01/2019   Tdap 08/06/2017    TDAP status: Up to date  Flu Vaccine status: Declined, Education has been provided regarding the importance of this vaccine but patient still declined. Advised may receive this vaccine at local pharmacy or Health Dept. Aware to provide a copy of the vaccination record if obtained from local pharmacy or Health Dept. Verbalized acceptance and understanding.  Pneumococcal vaccine status: Declined,  Education has been provided regarding the importance of this vaccine but patient still declined. Advised may receive this vaccine at local pharmacy or Health Dept. Aware to provide a copy of the vaccination record if obtained from local pharmacy or Health Dept. Verbalized acceptance and understanding.   Covid-19 vaccine status: Declined, Education has been provided regarding the importance of this vaccine but patient still declined. Advised may receive this vaccine at local pharmacy or Health Dept.or vaccine clinic. Aware to provide a copy of the vaccination record if obtained from local pharmacy or Health Dept. Verbalized acceptance and understanding.  Qualifies for Shingles Vaccine? No    Screening Tests Health Maintenance  Topic Date Due   Colonoscopy  01/18/2024 (Originally  02/06/2022)   Medicare Annual Wellness (AWV)  03/05/2024   MAMMOGRAM  01/08/2025   DTaP/Tdap/Td (2 - Td or Tdap) 08/07/2027   DEXA SCAN  Completed   Hepatitis C Screening  Completed   HPV VACCINES  Aged Out   Pneumonia Vaccine 57+ Years old   Discontinued   INFLUENZA VACCINE  Discontinued   COVID-19 Vaccine  Discontinued   Zoster Vaccines- Shingrix  Discontinued    Health Maintenance  There are no preventive care reminders to display for this patient.   Colorectal cancer screening: Type of screening: Colonoscopy. Completed 02/06/17. Repeat every 5 years, postponed until 01/18/24  Mammogram status: Completed 01/09/23. Repeat every year  Bone Density status: Completed 02/15/17. Results reflect: Bone density results: OSTEOPENIA. Repeat every 2 years.   Additional Screening:  Hepatitis C Screening:  Completed 01/04/19  Vision Screening: Recommended annual ophthalmology exams for early detection of glaucoma and other disorders of the eye. Is the patient up to date with their annual eye exam?  Yes  Who is the provider or what is the name of the office in which the patient attends annual eye exams? Dr Dione Booze  If pt is not established with a provider, would they like to be referred to a provider to establish care? No .   Dental Screening: Recommended annual dental exams for proper oral hygiene  Community Resource Referral / Chronic Care Management: CRR required this visit?  No   CCM required this visit?  No     Plan:     I have personally reviewed and noted the following in the patient's chart:   Medical and social history Use of alcohol, tobacco or illicit drugs  Current medications and supplements including opioid prescriptions. Patient is not currently taking opioid prescriptions. Functional ability and status Nutritional status Physical activity Advanced directives List of other physicians Hospitalizations, surgeries, and ER visits in previous 12 months Vitals Screenings to include cognitive, depression, and falls Referrals and appointments  In addition, I have reviewed and discussed with patient certain preventive protocols, quality metrics, and best practice recommendations. A written personalized care plan  for preventive services as well as general preventive health recommendations were provided to patient.     Marzella Schlein, LPN   02/05/1094   After Visit Summary: (MyChart) Due to this being a telephonic visit, the after visit summary with patients personalized plan was offered to patient via MyChart   Nurse Notes: none

## 2023-03-14 ENCOUNTER — Ambulatory Visit (AMBULATORY_SURGERY_CENTER): Payer: Medicare Other | Admitting: Gastroenterology

## 2023-03-14 ENCOUNTER — Encounter: Payer: Self-pay | Admitting: Gastroenterology

## 2023-03-14 VITALS — BP 112/70 | HR 58 | Temp 97.2°F | Resp 11 | Ht 68.0 in | Wt 175.0 lb

## 2023-03-14 DIAGNOSIS — K5730 Diverticulosis of large intestine without perforation or abscess without bleeding: Secondary | ICD-10-CM

## 2023-03-14 DIAGNOSIS — Z860101 Personal history of adenomatous and serrated colon polyps: Secondary | ICD-10-CM | POA: Diagnosis not present

## 2023-03-14 DIAGNOSIS — Z860100 Personal history of colon polyps, unspecified: Secondary | ICD-10-CM

## 2023-03-14 DIAGNOSIS — Z1211 Encounter for screening for malignant neoplasm of colon: Secondary | ICD-10-CM | POA: Diagnosis not present

## 2023-03-14 MED ORDER — SODIUM CHLORIDE 0.9 % IV SOLN: 500.0000 mL | INTRAVENOUS | Status: AC

## 2023-03-14 NOTE — Progress Notes (Signed)
VS by DT  Pt's states no medical or surgical changes since previsit or office visit.

## 2023-03-14 NOTE — Op Note (Signed)
[facility] Patient Name: Brittney Stark Procedure Date: 03/14/2023 8:28 AM MRN: [id number] Endoscopist: Sherilyn Cooter L. Myrtie Neither , MD, [id number] Age: 73 Referring MD:  Date of Birth: 1950-09-18 Gender: Female Account #: [id number] Procedure:                Colonoscopy Indications:              Surveillance: Personal history of adenomatous                            polyps on last colonoscopy 5 years ago                           SubCM TA x 02 Feb 2017 (Stark) - fair initial prep                            on that exam Medicines:                Monitored Anesthesia Care Procedure:                Pre-Anesthesia Assessment:                           - Prior to the procedure, a History and Physical                            was performed, and patient medications and                            allergies were reviewed. The patient's tolerance of                            previous anesthesia was also reviewed. The risks                            and benefits of the procedure and the sedation                            options and risks were discussed with the patient.                            All questions were answered, and informed consent                            was obtained. Prior Anticoagulants: The patient has                            taken no anticoagulant or antiplatelet agents. ASA                            Grade Assessment: II - A patient with mild systemic                            disease. After reviewing the risks and benefits,  the patient was deemed in satisfactory condition to                            undergo the procedure.                           After obtaining informed consent, the colonoscope                            was passed under direct vision. Throughout the                            procedure, the patient's blood pressure, pulse, and                            oxygen saturations were monitored continuously. The                             CF [id number] #[id number] was introduced through the anus                            and advanced to the the cecum, identified by                            appendiceal orifice and ileocecal valve. The                            colonoscopy was extremely difficult due to a                            redundant colon and significant looping. Successful                            completion of the procedure was aided by changing                            the patient to a semi-prone position, using manual                            pressure and straightening and shortening the scope                            to obtain bowel loop reduction. The patient                            tolerated the procedure well. The quality of the                            bowel preparation was good. The ileocecal valve,                            appendiceal orifice, and rectum were photographed.  The bowel preparation used was 2 day Suprep/Miralax. Scope In: 8:44:41 AM Scope Out: 9:08:48 AM Scope Withdrawal Time: 0 hours 10 minutes 15 seconds  Total Procedure Duration: 0 hours 24 minutes 7 seconds  Findings:                 The perianal and digital rectal examinations were                            normal.                           The colon (entire examined portion) was                            significantly redundant.                           Repeat examination of right colon under NBI                            performed.                           Multiple diverticula were found in the left colon.                           The exam was otherwise without abnormality on                            direct and retroflexion views. Complications:            No immediate complications. Estimated Blood Loss:     Estimated blood loss: none. Impression:               - Redundant colon.                           - Diverticulosis in the left colon.                           - The  examination was otherwise normal on direct                            and retroflexion views.                           - No specimens collected. Recommendation:           - Patient has a contact number available for                            emergencies. The signs and symptoms of potential                            delayed complications were discussed with the                            patient. Return to normal activities tomorrow.  Written discharge instructions were provided to the                            patient.                           - Resume previous diet.                           - Continue present medications.                           - No repeat routine surveillance colonoscopy due to                            age, current guidelines and low risk findings today. Annabelle Rexroad L. Myrtie Neither, MD 03/14/2023 9:16:50 AM This report has been signed electronically.

## 2023-03-14 NOTE — Patient Instructions (Addendum)
YOU HAD AN ENDOSCOPIC PROCEDURE TODAY AT THE [facility]:   Refer to the procedure report that was given to you for any specific questions about what was found during the examination.  If the procedure report does not answer your questions, please call your gastroenterologist to clarify.  If you requested that your care partner not be given the details of your procedure findings, then the procedure report has been included in a sealed envelope for you to review at your convenience later.  Please read handouts provided. Continue present medications. Resume previous diet.   YOU SHOULD EXPECT: Some feelings of bloating in the abdomen. Passage of more gas than usual.  Walking can help get rid of the air that was put into your GI tract during the procedure and reduce the bloating. If you had a lower endoscopy (such as a colonoscopy or flexible sigmoidoscopy) you may notice spotting of blood in your stool or on the toilet paper. If you underwent a bowel prep for your procedure, you may not have a normal bowel movement for a few days.  Please Note:  You might notice some irritation and congestion in your nose or some drainage.  This is from the oxygen used during your procedure.  There is no need for concern and it should clear up in a day or so.  SYMPTOMS TO REPORT IMMEDIATELY:  Following lower endoscopy (colonoscopy or flexible sigmoidoscopy):  Excessive amounts of blood in the stool  Significant tenderness or worsening of abdominal pains  Swelling of the abdomen that is new, acute  Fever of 100F or higher.  For urgent or emergent issues, a gastroenterologist can be reached at any hour by calling [phone]. Do not use MyChart messaging for urgent concerns.    DIET:  We do recommend a small meal at first, but then you may proceed to your regular diet.  Drink plenty of fluids but you should avoid alcoholic beverages for 24 hours.  ACTIVITY:  You should plan to take it easy for  the rest of today and you should NOT DRIVE or use heavy machinery until tomorrow (because of the sedation medicines used during the test).    FOLLOW UP: Our staff will call the number listed on your records the next business day following your procedure.  We will call around 7:15- 8:00 am to check on you and address any questions or concerns that you may have regarding the information given to you following your procedure. If we do not reach you, we will leave a message.     If any biopsies were taken you will be contacted by phone or by letter within the next 1-3 weeks.  Please call us at [phone] if you have not heard about the biopsies in 3 weeks.    SIGNATURES/CONFIDENTIALITY: You and/or your care partner have signed paperwork which will be entered into your electronic medical record.  These signatures attest to the fact that that the information above on your After Visit Summary has been reviewed and is understood.  Full responsibility of the confidentiality of this discharge information lies with you and/or your care-partner.

## 2023-03-14 NOTE — Progress Notes (Signed)
History and Physical:  This patient presents for endoscopic testing for: Encounter Diagnosis  Name Primary?   Hx of colonic polyps Yes    Sub CM TA x 02 Feb 2017 (Stark) - required extensive lavage for better prep. 2 day prep for this exam - reports it is clear Patient denies chronic abdominal pain, rectal bleeding, constipation or diarrhea.   Patient is otherwise without complaints or active issues today.   Past Medical History: Past Medical History:  Diagnosis Date   Allergy    Arthritis    Seasonal allergies      Past Surgical History: Past Surgical History:  Procedure Laterality Date   COLONOSCOPY     HIP ARTHROPLASTY Left    04/13/21 - Dr. Lequita Halt   TOTAL VAGINAL HYSTERECTOMY     WISDOM TOOTH EXTRACTION      Allergies: No Known Allergies  Outpatient Meds: Current Outpatient Medications  Medication Sig Dispense Refill   ASHWAGANDHA PO Take by mouth.     Calcium Carb-Cholecalciferol (CALCIUM 500 + D PO) Take by mouth. With vit d3     Magnesium 250 MG TABS Take by mouth.     Multiple Vitamin (MULTIVITAMIN) capsule Take 1 capsule by mouth daily.     TURMERIC PO Take by mouth.     Current Facility-Administered Medications  Medication Dose Route Frequency Provider Last Rate Last Admin   0.9 %  sodium chloride infusion  500 mL Intravenous Continuous Danis, Starr Lake III, MD          ___________________________________________________________________ Objective   Exam:  BP 134/70   Pulse 64   Temp (!) 97.2 F (36.2 C)   Ht 5\' 8"  (1.727 m)   Wt 175 lb (79.4 kg)   BMI 26.61 kg/m   CV: regular , S1/S2 Resp: clear to auscultation bilaterally, normal RR and effort noted GI: soft, no tenderness, with active bowel sounds.   Assessment: Encounter Diagnosis  Name Primary?   Hx of colonic polyps Yes     Plan: Colonoscopy   The benefits and risks of the planned procedure were described in detail with the patient or (when appropriate) their health care  proxy.  Risks were outlined as including, but not limited to, bleeding, infection, perforation, adverse medication reaction leading to cardiac or pulmonary decompensation, pancreatitis (if ERCP).  The limitation of incomplete mucosal visualization was also discussed.  No guarantees or warranties were given.  The patient is appropriate for an endoscopic procedure in the ambulatory setting.   - Amada Jupiter, MD

## 2023-03-15 ENCOUNTER — Telehealth: Payer: Self-pay | Admitting: *Deleted

## 2023-03-15 NOTE — Telephone Encounter (Signed)
  Follow up Call-     03/14/2023    7:49 AM  Call back number  Post procedure Call Back phone  # [phone]  Permission to leave phone message Yes     Patient questions:  Do you have a fever, pain , or abdominal swelling? No. Pain Score  0 *  Have you tolerated food without any problems? Yes.    Have you been able to return to your normal activities? Yes.    Do you have any questions about your discharge instructions: Diet   No. Medications  No. Follow up visit  No.  Do you have questions or concerns about your Care? No.  Actions: * If pain score is 4 or above: No action needed, pain <4.

## 2023-06-22 DIAGNOSIS — M1711 Unilateral primary osteoarthritis, right knee: Secondary | ICD-10-CM | POA: Diagnosis not present

## 2023-06-22 DIAGNOSIS — Z96651 Presence of right artificial knee joint: Secondary | ICD-10-CM | POA: Diagnosis not present

## 2023-06-22 DIAGNOSIS — M25562 Pain in left knee: Secondary | ICD-10-CM | POA: Diagnosis not present

## 2023-08-01 DIAGNOSIS — H40013 Open angle with borderline findings, low risk, bilateral: Secondary | ICD-10-CM | POA: Diagnosis not present

## 2023-08-01 DIAGNOSIS — H16213 Exposure keratoconjunctivitis, bilateral: Secondary | ICD-10-CM | POA: Diagnosis not present

## 2023-08-01 DIAGNOSIS — H04123 Dry eye syndrome of bilateral lacrimal glands: Secondary | ICD-10-CM | POA: Diagnosis not present

## 2023-08-01 DIAGNOSIS — G43109 Migraine with aura, not intractable, without status migrainosus: Secondary | ICD-10-CM | POA: Diagnosis not present

## 2023-08-01 DIAGNOSIS — H2513 Age-related nuclear cataract, bilateral: Secondary | ICD-10-CM | POA: Diagnosis not present

## 2023-08-27 IMAGING — MG MM DIGITAL SCREENING BILAT W/ TOMO AND CAD
6 of 10 series · 6 of 30 positions shown · non-contrast
Comparison: Previous exam(s).

CLINICAL DATA: Screening.

EXAM:
DIGITAL SCREENING BILATERAL MAMMOGRAM WITH TOMOSYNTHESIS AND CAD
TECHNIQUE: Bilateral screening digital craniocaudal and mediolateral oblique
mammograms were obtained. Bilateral screening digital breast
tomosynthesis was performed. The images were evaluated with
computer-aided detection.

[L CC synth-2D (1 of 2)]
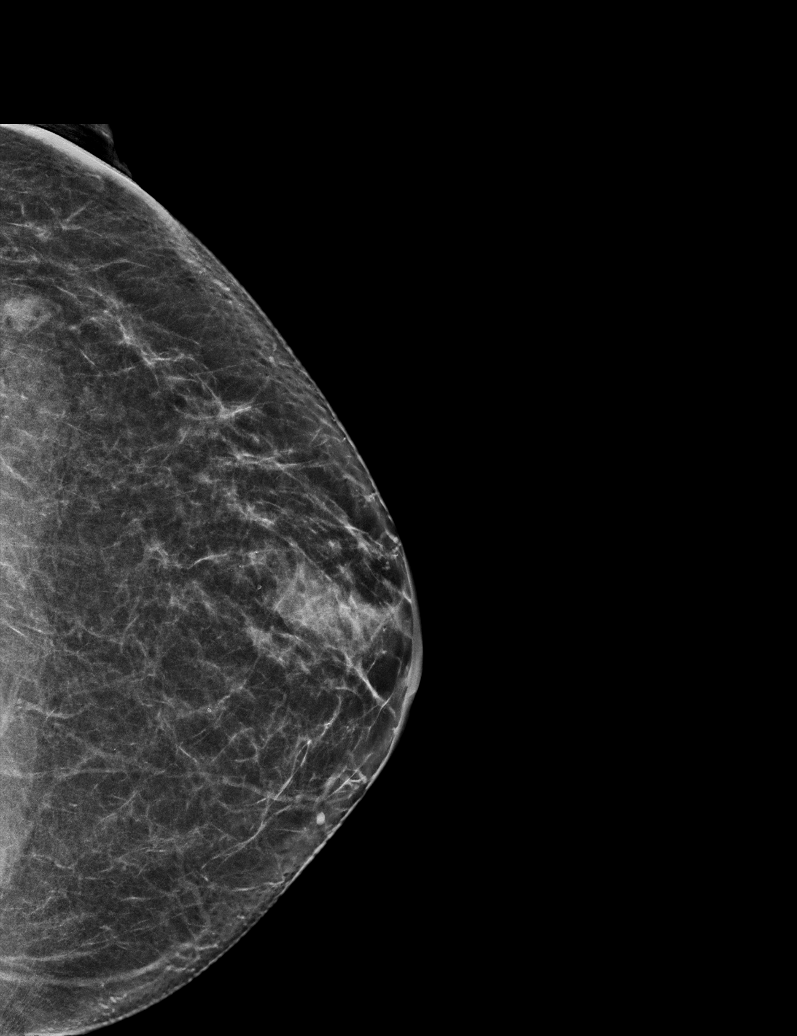

[R CC synth-2D]
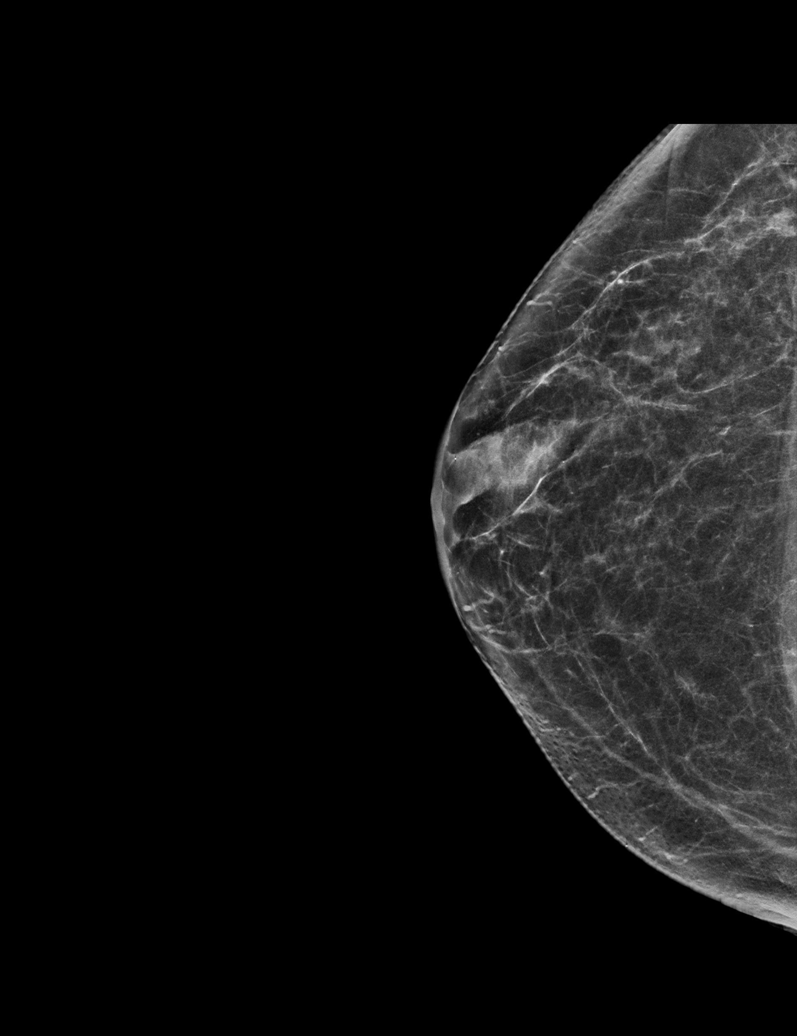

[R MLO synth-2D]
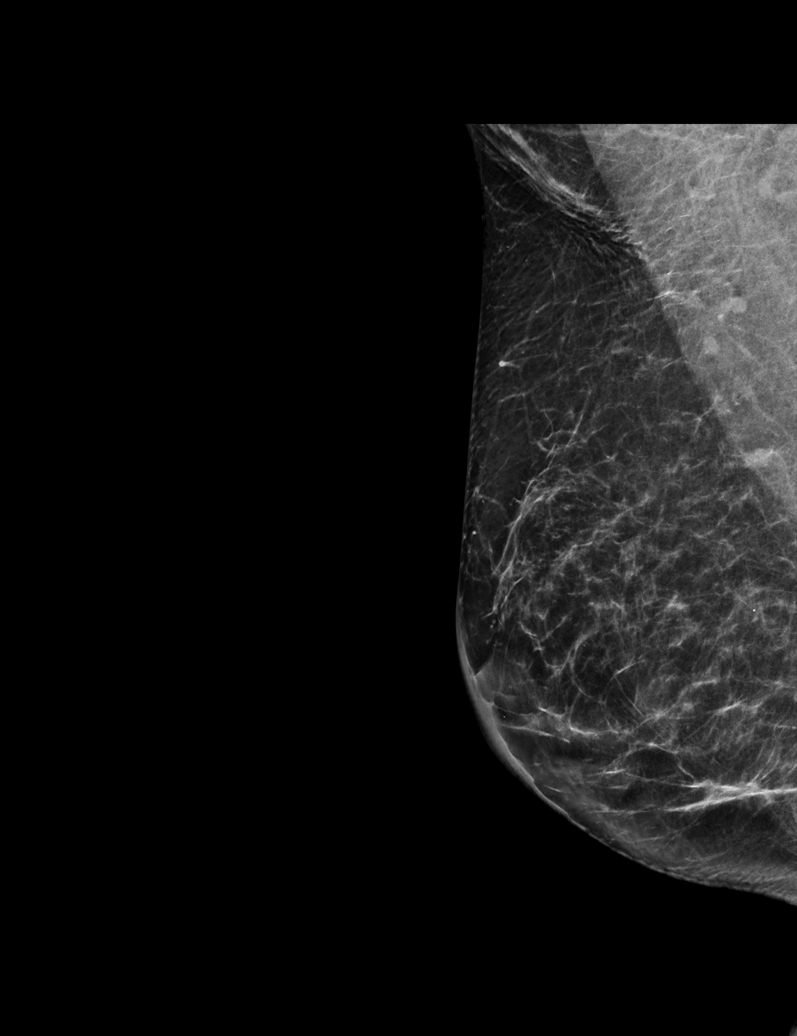

[L MLO synth-2D]
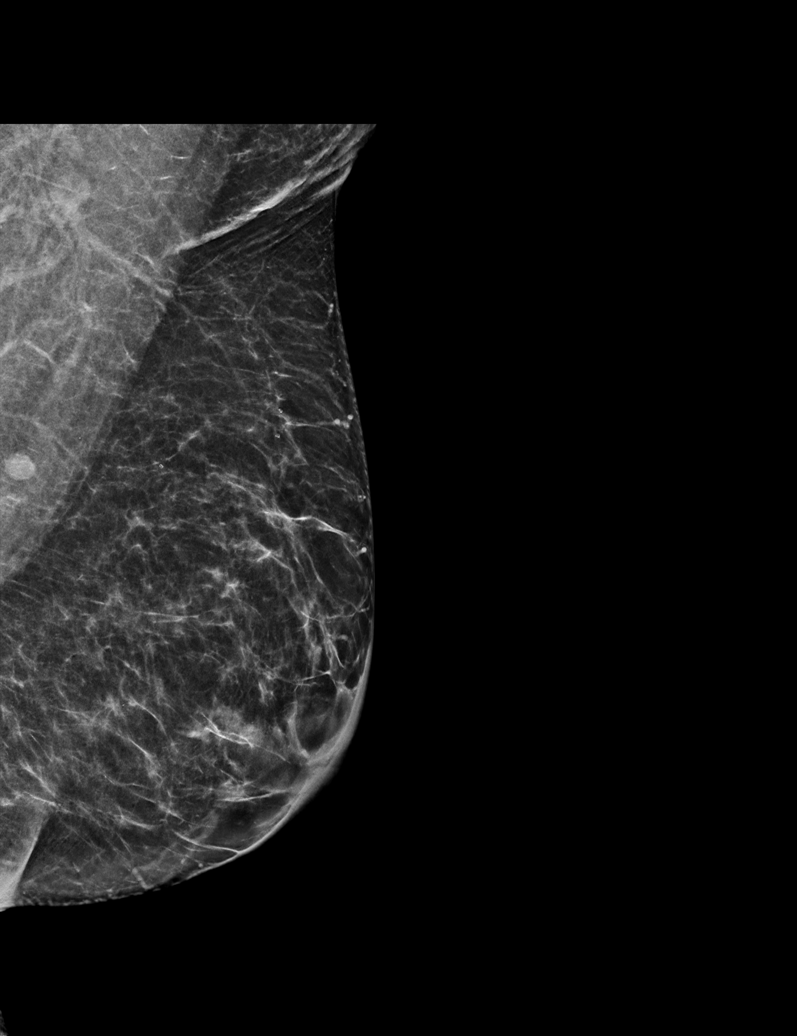

[L CC synth-2D (2 of 2)]
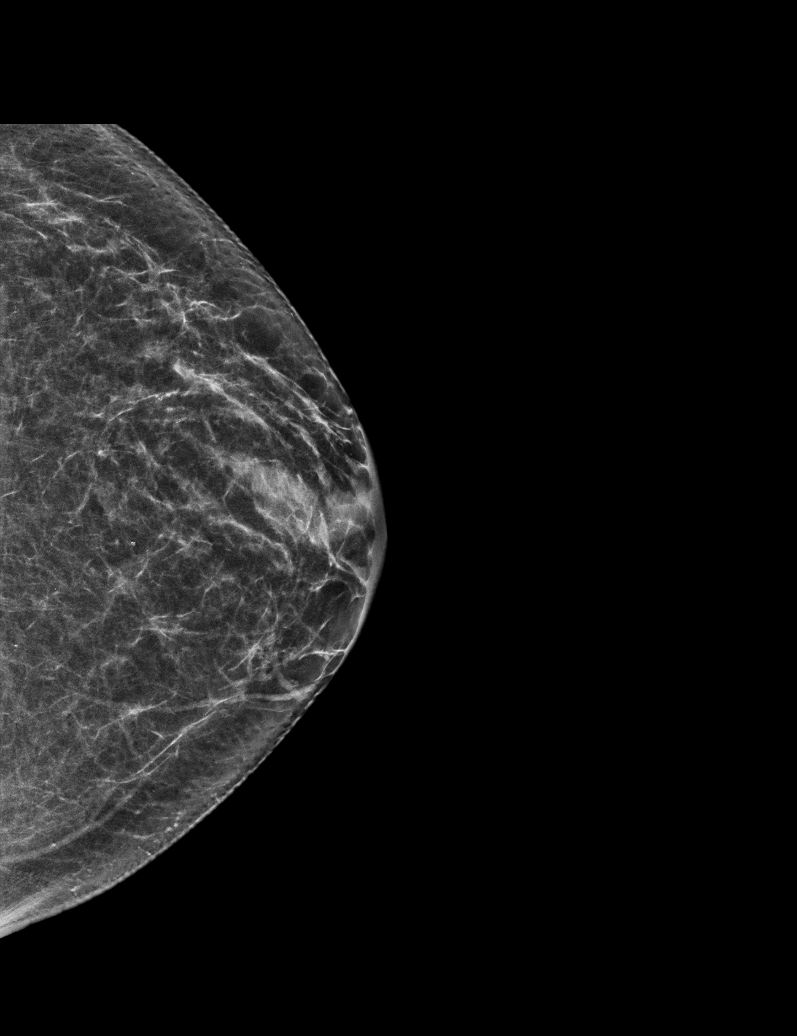

[L CC tomo · tomo slice 29/58.0]
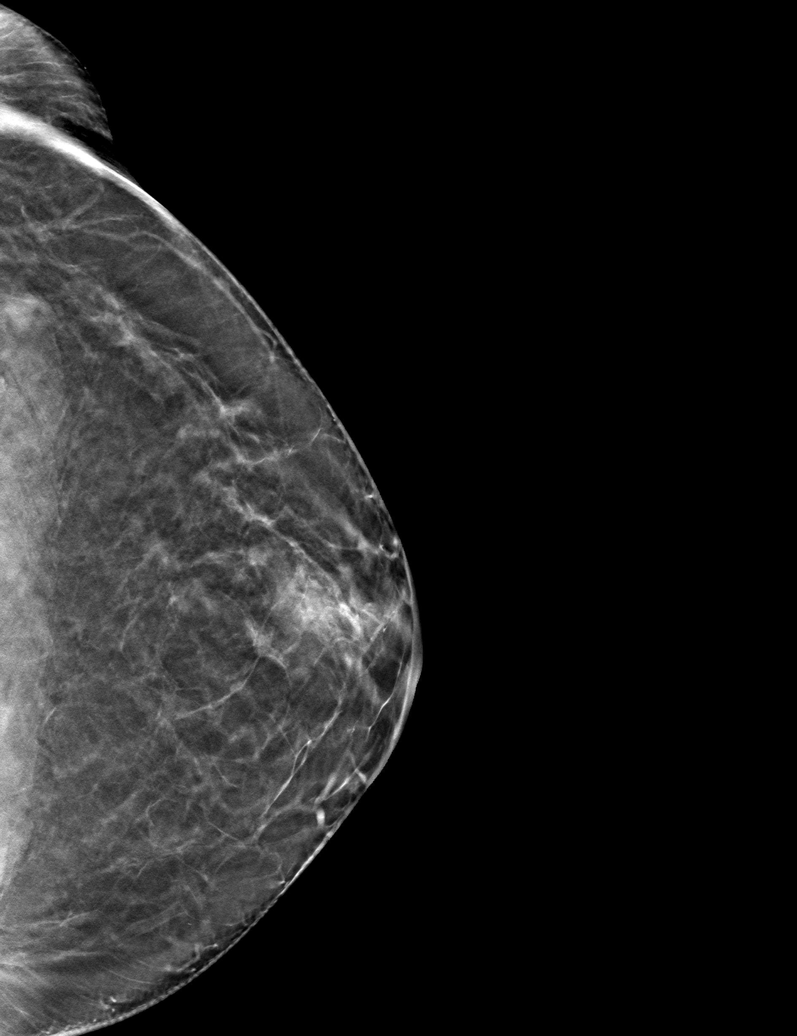

[6 of 30 positions shown; findings below may reference images not displayed]

ACR Breast Density Category b: There are scattered areas of
fibroglandular density.
FINDINGS: There are no findings suspicious for malignancy.
IMPRESSION: No mammographic evidence of malignancy. A result letter of this
screening mammogram will be mailed directly to the patient.

RECOMMENDATION:
Screening mammogram in one year. (Code:51-O-LD2)

BI-RADS CATEGORY  1: Negative.

## 2023-09-11 DIAGNOSIS — H04123 Dry eye syndrome of bilateral lacrimal glands: Secondary | ICD-10-CM | POA: Diagnosis not present

## 2023-09-11 DIAGNOSIS — H16213 Exposure keratoconjunctivitis, bilateral: Secondary | ICD-10-CM | POA: Diagnosis not present

## 2023-09-11 DIAGNOSIS — H40013 Open angle with borderline findings, low risk, bilateral: Secondary | ICD-10-CM | POA: Diagnosis not present

## 2023-09-11 DIAGNOSIS — H2513 Age-related nuclear cataract, bilateral: Secondary | ICD-10-CM | POA: Diagnosis not present

## 2023-09-12 ENCOUNTER — Ambulatory Visit (HOSPITAL_BASED_OUTPATIENT_CLINIC_OR_DEPARTMENT_OTHER)
Admission: RE | Admit: 2023-09-12 | Discharge: 2023-09-12 | Disposition: A | Discharge status: Home / Self Care | Source: Ambulatory Visit | Attending: Physician Assistant | Admitting: Physician Assistant

## 2023-09-12 ENCOUNTER — Other Ambulatory Visit: Payer: Medicare Other

## 2023-09-12 DIAGNOSIS — M85832 Other specified disorders of bone density and structure, left forearm: Secondary | ICD-10-CM | POA: Diagnosis not present

## 2023-09-12 DIAGNOSIS — Z780 Asymptomatic menopausal state: Secondary | ICD-10-CM | POA: Diagnosis not present

## 2023-09-12 DIAGNOSIS — M8580 Other specified disorders of bone density and structure, unspecified site: Secondary | ICD-10-CM | POA: Insufficient documentation

## 2023-09-15 ENCOUNTER — Ambulatory Visit: Payer: Self-pay | Admitting: Physician Assistant

## 2024-01-10 ENCOUNTER — Other Ambulatory Visit: Payer: Self-pay | Admitting: Physician Assistant

## 2024-01-10 DIAGNOSIS — Z1231 Encounter for screening mammogram for malignant neoplasm of breast: Secondary | ICD-10-CM

## 2024-01-29 ENCOUNTER — Ambulatory Visit: Payer: Medicare Other | Admitting: Physician Assistant

## 2024-02-05 ENCOUNTER — Ambulatory Visit (INDEPENDENT_AMBULATORY_CARE_PROVIDER_SITE_OTHER): Admitting: Physician Assistant

## 2024-02-05 ENCOUNTER — Encounter: Payer: Self-pay | Admitting: Physician Assistant

## 2024-02-05 VITALS — BP 132/78 | HR 67 | Temp 97.1°F | Ht 68.0 in | Wt 193.6 lb

## 2024-02-05 DIAGNOSIS — D729 Disorder of white blood cells, unspecified: Secondary | ICD-10-CM | POA: Diagnosis not present

## 2024-02-05 DIAGNOSIS — M8580 Other specified disorders of bone density and structure, unspecified site: Secondary | ICD-10-CM

## 2024-02-05 DIAGNOSIS — L720 Epidermal cyst: Secondary | ICD-10-CM | POA: Insufficient documentation

## 2024-02-05 DIAGNOSIS — K5730 Diverticulosis of large intestine without perforation or abscess without bleeding: Secondary | ICD-10-CM

## 2024-02-05 DIAGNOSIS — Z0000 Encounter for general adult medical examination without abnormal findings: Secondary | ICD-10-CM

## 2024-02-05 NOTE — Progress Notes (Signed)
 "   Patient ID: Brittney Stark, female    DOB: 08-31-1950, 74 y.o.   MRN: [id number]   Assessment & Plan:  Encounter for annual physical exam -     CBC with Differential/Platelet; Future -     Comprehensive metabolic panel with GFR; Future -     Lipid panel; Future -     TSH; Future  Abnormal white blood cell -     CBC with Differential/Platelet; Future  Osteopenia, unspecified location  Diverticula of colon      Assessment and Plan Assessment & Plan Osteopenia Confirmed by bone density scan. Engages in weight training exercises to improve bone health. - Continue weight training exercises & Ca, Vit D supplement   Diverticulosis of colon Diverticulosis identified during colonoscopy. No history of diverticulitis. Educated on symptoms of diverticulitis, including severe left-sided abdominal pain and diarrhea, and the importance of seeking medical attention if symptoms occur. - Educated on symptoms of diverticulitis and advised to seek medical attention if symptoms occur.  Abnormal white blood cell count Previous abnormal white blood cell count possibly due to a cold. No current symptoms suggestive of infection or other underlying conditions. - Ordered fasting blood work to monitor white blood cell count and other parameters.  General Health Maintenance Routine health maintenance is up to date. Colonoscopy completed in February of last year with no further follow-up needed. Mammogram scheduled for January 13th, 2026. Bone density scan due in 2027. Vision and dental health are current. No new skin changes or concerning moles. No family history of melanoma. Engages in regular physical activity and maintains a healthy diet. No substance use concerns. Mental health is stable. No urinary symptoms reported. - Continue regular physical activity and healthy diet. - Ensure mammogram is completed on January 13th, 2026. - Schedule bone density scan for 2027. - Monitor for any new skin changes or  concerning moles. - Maintain regular follow-up for routine health maintenance.  Age-appropriate screening and counseling performed today. Will check labs and call with results. Preventive measures discussed and printed in AVS for patient.   Patient Counseling: [x]   Nutrition: Stressed importance of moderation in sodium/caffeine intake, saturated fat and cholesterol, caloric balance, sufficient intake of fresh fruits, vegetables, and fiber.  [x]   Stressed the importance of regular exercise.   []   Substance Abuse: Discussed cessation/primary prevention of tobacco, alcohol, or other drug use; driving or other dangerous activities under the influence; availability of treatment for abuse.   []   Injury prevention: Discussed safety belts, safety helmets, smoke detector, smoking near bedding or upholstery.   []   Sexuality: Discussed sexually transmitted diseases, partner selection, use of condoms, avoidance of unintended pregnancy  and contraceptive alternatives.   [x]   Dental health: Discussed importance of regular tooth brushing, flossing, and dental visits.  [x]   Health maintenance and immunizations reviewed. Please refer to Health maintenance section.         Return in about 1 year (around 02/04/2025) for physical.    Subjective:    Chief Complaint  Patient presents with   Annual Exam    Here for annual exam. No questions or concerns. Has not fasted- two boiled eggs, salsa, and apple. Black coffee.     HPI Discussed the use of AI scribe software for clinical note transcription with the patient, who gave verbal consent to proceed.  History of Present Illness Brittney Stark is a 74 year old female who presents for her annual physical exam.  She has not experienced any  significant changes in her health since her last visit in December 2024. She experiences normal musculoskeletal aches and pains but no other major health concerns. She maintains an active lifestyle, walking regularly, and  reports good nutrition and sleep habits. She has no substance use concerns.  She had a colonoscopy in February of last year, which showed diverticulosis. She has no history of diverticulitis. She completed a bone density scan and was told she has osteopenia. She engages in weight training exercises at the gym to manage her bone health. Her vitamin D  levels were adequate in her last lab results.  Her mammogram is scheduled for the 13th of this month, and she reports no changes in her breasts. She does not take flu shots or shingles vaccines.  She had a hysterectomy due to fibroids, which were not cancerous. She reports no urinary issues or overactive bladder symptoms.  She has a family history of longevity, with her father living to 7 years old. She has no family history of melanoma.     Past Medical History:  Diagnosis Date   Allergy    Arthritis    Seasonal allergies     Past Surgical History:  Procedure Laterality Date   COLONOSCOPY     HIP ARTHROPLASTY Left    04/13/21 - Dr. Melodi   TOTAL VAGINAL HYSTERECTOMY     WISDOM TOOTH EXTRACTION      Family History  Problem Relation Age of Onset   Stroke Mother    Heart disease Mother    Diabetes Mother    Colon polyps Brother    Colon cancer Paternal Grandfather    Esophageal cancer Neg Hx    Rectal cancer Neg Hx    Stomach cancer Neg Hx     Social History[1]   Allergies[2]  Review of Systems NEGATIVE UNLESS OTHERWISE INDICATED IN HPI      Objective:     BP 132/78 (BP Location: Right Arm, Patient Position: Sitting, Cuff Size: Normal)   Pulse 67   Temp (!) 97.1 F (36.2 C) (Temporal)   Ht 5' 8 (1.727 m)   Wt 193 lb 9.6 oz (87.8 kg)   SpO2 97%   BMI 29.44 kg/m   Wt Readings from Last 3 Encounters:  02/05/24 193 lb 9.6 oz (87.8 kg)  03/14/23 175 lb (79.4 kg)  03/06/23 175 lb (79.4 kg)    BP Readings from Last 3 Encounters:  02/05/24 132/78  03/14/23 112/70  01/18/23 115/76     Physical  Exam Vitals and nursing note reviewed.  Constitutional:      Appearance: Normal appearance. She is normal weight. She is not toxic-appearing.  HENT:     Head: Normocephalic and atraumatic.     Right Ear: Tympanic membrane, ear canal and external ear normal.     Left Ear: Tympanic membrane, ear canal and external ear normal.     Nose: Nose normal.     Mouth/Throat:     Mouth: Mucous membranes are moist.  Eyes:     Extraocular Movements: Extraocular movements intact.     Conjunctiva/sclera: Conjunctivae normal.     Pupils: Pupils are equal, round, and reactive to light.  Cardiovascular:     Rate and Rhythm: Normal rate and regular rhythm.     Pulses: Normal pulses.     Heart sounds: Normal heart sounds.  Pulmonary:     Effort: Pulmonary effort is normal.     Breath sounds: Normal breath sounds.  Abdominal:     General:  Abdomen is flat. Bowel sounds are normal.     Palpations: Abdomen is soft.  Musculoskeletal:        General: Normal range of motion.     Cervical back: Normal range of motion and neck supple.  Skin:    General: Skin is warm and dry.     Findings: No lesion or rash.     Comments: Lipoma R forearm  Neurological:     General: No focal deficit present.     Mental Status: She is alert and oriented to person, place, and time.  Psychiatric:        Mood and Affect: Mood normal.        Behavior: Behavior normal.        Thought Content: Thought content normal.        Judgment: Judgment normal.             Bengie Kaucher M Meckenzie Balsley, PA-C     [1]  Social History Tobacco Use   Smoking status: Never   Smokeless tobacco: Never  Vaping Use   Vaping status: Never Used  Substance Use Topics   Alcohol use: Yes    Alcohol/week: 3.0 standard drinks of alcohol    Types: 3 Glasses of wine per week   Drug use: No  [2] No Known Allergies  "

## 2024-02-06 ENCOUNTER — Other Ambulatory Visit (INDEPENDENT_AMBULATORY_CARE_PROVIDER_SITE_OTHER)

## 2024-02-06 DIAGNOSIS — D729 Disorder of white blood cells, unspecified: Secondary | ICD-10-CM

## 2024-02-06 DIAGNOSIS — Z0000 Encounter for general adult medical examination without abnormal findings: Secondary | ICD-10-CM

## 2024-02-06 DIAGNOSIS — Z13220 Encounter for screening for lipoid disorders: Secondary | ICD-10-CM | POA: Diagnosis not present

## 2024-02-06 LAB — CBC WITH DIFFERENTIAL/PLATELET
Basophils Absolute: 0 K/uL (ref 0.0–0.1)
Basophils Relative: 0.7 % (ref 0.0–3.0)
Eosinophils Absolute: 0.1 K/uL (ref 0.0–0.7)
Eosinophils Relative: 1.8 % (ref 0.0–5.0)
HCT: 40.2 % (ref 36.0–46.0)
Hemoglobin: 13.7 g/dL (ref 12.0–15.0)
Lymphocytes Relative: 34.4 % (ref 12.0–46.0)
Lymphs Abs: 1.3 K/uL (ref 0.7–4.0)
MCHC: 34.1 g/dL (ref 30.0–36.0)
MCV: 102.1 fl — ABNORMAL HIGH (ref 78.0–100.0)
Monocytes Absolute: 0.3 K/uL (ref 0.1–1.0)
Monocytes Relative: 7.4 % (ref 3.0–12.0)
Neutro Abs: 2.1 K/uL (ref 1.4–7.7)
Neutrophils Relative %: 55.7 % (ref 43.0–77.0)
Platelets: 283 K/uL (ref 150.0–400.0)
RBC: 3.94 Mil/uL (ref 3.87–5.11)
RDW: 13.2 % (ref 11.5–15.5)
WBC: 3.7 K/uL — ABNORMAL LOW (ref 4.0–10.5)

## 2024-02-06 LAB — COMPREHENSIVE METABOLIC PANEL WITH GFR
ALT: 16 U/L (ref 3–35)
AST: 22 U/L (ref 5–37)
Albumin: 4.8 g/dL (ref 3.5–5.2)
Alkaline Phosphatase: 42 U/L (ref 39–117)
BUN: 14 mg/dL (ref 6–23)
CO2: 28 meq/L (ref 19–32)
Calcium: 9.3 mg/dL (ref 8.4–10.5)
Chloride: 100 meq/L (ref 96–112)
Creatinine, Ser: 0.84 mg/dL (ref 0.40–1.20)
GFR: 69.19 mL/min
Glucose, Bld: 92 mg/dL (ref 70–99)
Potassium: 4.5 meq/L (ref 3.5–5.1)
Sodium: 137 meq/L (ref 135–145)
Total Bilirubin: 0.7 mg/dL (ref 0.2–1.2)
Total Protein: 6.8 g/dL (ref 6.0–8.3)

## 2024-02-06 LAB — LIPID PANEL
Cholesterol: 205 mg/dL — ABNORMAL HIGH (ref 28–200)
HDL: 71.8 mg/dL
LDL Cholesterol: 114 mg/dL — ABNORMAL HIGH (ref 10–99)
NonHDL: 133.15
Total CHOL/HDL Ratio: 3
Triglycerides: 98 mg/dL (ref 10.0–149.0)
VLDL: 19.6 mg/dL (ref 0.0–40.0)

## 2024-02-06 LAB — TSH: TSH: 0.57 u[IU]/mL (ref 0.35–5.50)

## 2024-02-11 ENCOUNTER — Ambulatory Visit: Payer: Self-pay | Admitting: Physician Assistant

## 2024-02-13 ENCOUNTER — Ambulatory Visit
Admission: RE | Admit: 2024-02-13 | Discharge: 2024-02-13 | Disposition: A | Source: Ambulatory Visit | Attending: Physician Assistant

## 2024-02-13 DIAGNOSIS — Z1231 Encounter for screening mammogram for malignant neoplasm of breast: Secondary | ICD-10-CM

## 2024-02-15 ENCOUNTER — Ambulatory Visit: Payer: Self-pay | Admitting: Physician Assistant

## 2024-02-20 ENCOUNTER — Telehealth: Payer: Self-pay

## 2024-02-20 NOTE — Telephone Encounter (Signed)
 Copied from CRM #[id number]. Topic: Clinical - Lab/Test Results >> Feb 20, 2024  1:01 PM Mercedes MATSU wrote: Reason for CRM: Patient called in stating that nobody has called her to go over her lab results in detail with her. I relayed mychart message and she still requests a phone call from PA Allwardt or her nurse. She can be reached at [phone].  Called pt and she stated she now wants to discuss lab results since these were printed and mailed to her. Pt only has questions regarding LDL in Cholesterol and advised provider recommendations. Also asked about MCV and advised this is running about the same as the past several years and no concerns at this time. Pt verbalized understanding.

## 2024-03-11 ENCOUNTER — Ambulatory Visit: Payer: Medicare Other

## 2025-02-05 ENCOUNTER — Encounter: Admitting: Physician Assistant
